# Patient Record
Sex: Female | Born: 1955 | Race: White | Hispanic: No | Smoking: Never smoker
Health system: Southern US, Community
[De-identification: ages and names within clinical notes are randomized; demographics above are authoritative.]

## PROBLEM LIST (undated history)

## (undated) DIAGNOSIS — R002 Palpitations: Secondary | ICD-10-CM

## (undated) DIAGNOSIS — E559 Vitamin D deficiency, unspecified: Secondary | ICD-10-CM

## (undated) DIAGNOSIS — K8689 Other specified diseases of pancreas: Secondary | ICD-10-CM

## (undated) DIAGNOSIS — J40 Bronchitis, not specified as acute or chronic: Secondary | ICD-10-CM

## (undated) DIAGNOSIS — G43909 Migraine, unspecified, not intractable, without status migrainosus: Secondary | ICD-10-CM

## (undated) DIAGNOSIS — E538 Deficiency of other specified B group vitamins: Secondary | ICD-10-CM

## (undated) DIAGNOSIS — G40909 Epilepsy, unspecified, not intractable, without status epilepticus: Secondary | ICD-10-CM

## (undated) DIAGNOSIS — R1013 Epigastric pain: Secondary | ICD-10-CM

## (undated) DIAGNOSIS — L570 Actinic keratosis: Secondary | ICD-10-CM

## (undated) DIAGNOSIS — K449 Diaphragmatic hernia without obstruction or gangrene: Secondary | ICD-10-CM

## (undated) DIAGNOSIS — I10 Essential (primary) hypertension: Secondary | ICD-10-CM

## (undated) HISTORY — DX: Actinic keratosis: L57.0

## (undated) HISTORY — PX: BREAST SURGERY: SHX581

## (undated) HISTORY — PX: OTHER SURGICAL HISTORY: SHX169

## (undated) HISTORY — DX: Migraine, unspecified, not intractable, without status migrainosus: G43.909

## (undated) HISTORY — PX: CHOLECYSTECTOMY: SHX55

## (undated) HISTORY — PX: BREAST CYST ASPIRATION: SHX578

## (undated) HISTORY — PX: HERNIA REPAIR: SHX51

## (undated) HISTORY — DX: Bronchitis, not specified as acute or chronic: J40

## (undated) HISTORY — PX: MASTECTOMY: SHX3

---

## 2005-01-31 ENCOUNTER — Ambulatory Visit: Payer: Self-pay

## 2005-11-07 ENCOUNTER — Ambulatory Visit: Payer: Self-pay | Admitting: Gastroenterology

## 2005-12-20 ENCOUNTER — Emergency Department: Payer: Self-pay | Admitting: Unknown Physician Specialty

## 2006-02-06 ENCOUNTER — Ambulatory Visit: Payer: Self-pay

## 2006-04-11 ENCOUNTER — Ambulatory Visit: Payer: Self-pay | Admitting: Gastroenterology

## 2006-04-22 ENCOUNTER — Ambulatory Visit: Payer: Self-pay | Admitting: General Surgery

## 2006-07-17 ENCOUNTER — Ambulatory Visit: Payer: Self-pay | Admitting: Gastroenterology

## 2007-02-15 ENCOUNTER — Emergency Department: Payer: Self-pay | Admitting: General Practice

## 2007-02-23 ENCOUNTER — Ambulatory Visit: Payer: Self-pay

## 2007-11-10 ENCOUNTER — Ambulatory Visit: Payer: Self-pay | Admitting: Gastroenterology

## 2007-11-26 ENCOUNTER — Ambulatory Visit: Payer: Self-pay | Admitting: Gastroenterology

## 2008-02-25 ENCOUNTER — Ambulatory Visit: Payer: Self-pay

## 2008-05-24 ENCOUNTER — Ambulatory Visit: Payer: Self-pay | Admitting: Oncology

## 2008-06-01 ENCOUNTER — Ambulatory Visit: Payer: Self-pay | Admitting: Oncology

## 2008-06-02 ENCOUNTER — Ambulatory Visit: Payer: Self-pay | Admitting: Family Medicine

## 2008-06-23 ENCOUNTER — Ambulatory Visit: Payer: Self-pay | Admitting: Oncology

## 2008-07-24 ENCOUNTER — Ambulatory Visit: Payer: Self-pay | Admitting: Oncology

## 2008-09-23 ENCOUNTER — Ambulatory Visit: Payer: Self-pay | Admitting: Oncology

## 2008-10-07 ENCOUNTER — Ambulatory Visit: Payer: Self-pay | Admitting: Oncology

## 2008-10-24 ENCOUNTER — Ambulatory Visit: Payer: Self-pay | Admitting: Oncology

## 2008-11-03 ENCOUNTER — Ambulatory Visit: Payer: Self-pay | Admitting: Ophthalmology

## 2008-12-06 ENCOUNTER — Ambulatory Visit: Payer: Self-pay | Admitting: Family Medicine

## 2009-02-27 ENCOUNTER — Ambulatory Visit: Payer: Self-pay | Admitting: Obstetrics and Gynecology

## 2009-03-02 ENCOUNTER — Ambulatory Visit: Payer: Self-pay | Admitting: Obstetrics and Gynecology

## 2009-03-23 ENCOUNTER — Ambulatory Visit: Payer: Self-pay | Admitting: Oncology

## 2009-04-14 ENCOUNTER — Ambulatory Visit: Payer: Self-pay | Admitting: Oncology

## 2009-04-23 ENCOUNTER — Ambulatory Visit: Payer: Self-pay | Admitting: Oncology

## 2009-05-24 ENCOUNTER — Ambulatory Visit: Payer: Self-pay | Admitting: Oncology

## 2009-06-14 ENCOUNTER — Ambulatory Visit: Payer: Self-pay | Admitting: Surgery

## 2012-12-28 ENCOUNTER — Ambulatory Visit: Payer: Self-pay | Admitting: Family Medicine

## 2013-11-13 ENCOUNTER — Emergency Department: Payer: Self-pay | Admitting: Internal Medicine

## 2013-11-29 ENCOUNTER — Encounter: Payer: Self-pay | Admitting: General Surgery

## 2013-12-07 ENCOUNTER — Encounter: Payer: Self-pay | Admitting: General Surgery

## 2013-12-07 ENCOUNTER — Ambulatory Visit (INDEPENDENT_AMBULATORY_CARE_PROVIDER_SITE_OTHER): Payer: No Typology Code available for payment source | Admitting: General Surgery

## 2013-12-07 VITALS — BP 134/66 | HR 84 | Resp 12 | Ht 62.0 in | Wt 178.0 lb

## 2013-12-07 DIAGNOSIS — K449 Diaphragmatic hernia without obstruction or gangrene: Secondary | ICD-10-CM

## 2013-12-07 NOTE — Patient Instructions (Addendum)
Upper Gastrointestinal Series An upper gastrointestinal (GI) series is a medical test with X-rays that helps diagnose problems of the upper GI tract. The upper GI tract includes the esophagus, stomach, and duodenum. The duodenum is the first part of the small intestine. This exam is used to look for heartburn, reflux, sores (ulcers), abnormal growths, and other problems. LET YOUR CAREGIVER KNOW ABOUT:  Allergies to food or medicine.  Medicines taken, including vitamins, herbs, eyedrops, over-the-counter medicines, and creams.  Previous problems with barium.  Any constipation problems.  Breastfeeding, if this applies.  Possibility of pregnancy, if this applies. RISKS AND COMPLICATIONS Problems do not occur often with this procedure, but they are possible. Possible problems include:  An allergic reaction to barium (rare).  Nausea after drinking the barium.  Problems from the very small amount of radiation exposure.  Cramps or constipation. BEFORE THE PROCEDURE  Several days before the exam, you may need to change what you eat. Follow your caregiver's directions.  The night before the exam, do not eat or drink anything after midnight.  Ask your caregiver if it is okay to take any needed medicines with a sip of water.  If you have diabetes and need to take insulin, ask for instructions on how to do this before the exam. PROCEDURE An upper GI series is done while you are awake. You will not need pain medicine. The exam usually takes about 1 hour. The length of the exam depends on how long it takes for the barium to move through your system. It also depends on what is found during the exam.   You will drink barium. This is like a milkshake that tastes like chalk. It might make you feel bloated. Barium helps the inside of the upper GI tract to show up clearly on the screen.  You may also swallow "fizzies." This is a substance that causes air to build up in your stomach.  A type of  X-ray called fluoroscopy will be used. You may need to stand up or lie on a table. The table may move or tilt. You may need to turn from side to side. This is done to get pictures from different angles.  The exam is done when all the needed pictures have been taken. AFTER THE PROCEDURE  You may go back to your normal diet and activities right away.  Ask when your test results will be ready. Follow up with your caregiver to discuss your test results. Document Released: 09/06/2000 Document Revised: 12/02/2011 Document Reviewed: 07/22/2011 [facility] Patient Information 2014 [place], [place].  Patient has been scheduled for an upper GI at [facility] for 12-14-13 at 9:30 (arrive 9 am). Prep: NPO after midnight. She is aware of all instructions.

## 2013-12-07 NOTE — Progress Notes (Addendum)
Patient ID: Shona Pardo, female   DOB: 05-13-1956, 58 y.o.   MRN: [id number]  Chief Complaint  Patient presents with  . Other    New Patient evaluation of hiatal hernia    HPI Donnalynn Wheeless is a 58 y.o. female who presents for an evaluation of a hiatal hernia. The patient presented to the emergency department in February with reports of persistent cough.  A chest xray was done 11-13-13. Patient states she having heart burn for the last 20 years. She states when she eat are gets some pain in the left upper quadrant occasionally. Patient states when the pain comes it last from 30 minutes  to one hour. She has found that the pain is directly related to the volume of food. She describes that she has had dilatations of the esophagus in the past. A cholecystectomy was completed in the past without any improvement in her postprandial pain. At this time, she is not reporting any dysphasia. She has lost about 20 pounds with modification of her diet (volume).  Inguinal hernia repairs (x3 on the right, x1 on the left) were completed at [facility]. Her cholecystectomy was completed locally.   The patient is employed as a Scientist, water quality at [employer].  HPI  Past Medical History  Diagnosis Date  . Migraine   . Bronchitis     Past Surgical History  Procedure Laterality Date  . Breast surgery Right     lumpectomy  . Cholecystectomy      [facility]  . Hernia repair Right     x 2  . Hernia repair Left     [place]  . Haital hernia      Family History  Problem Relation Age of Onset  . Cancer Mother     breast and colon  . Stroke Mother     Social History History  Substance Use Topics  . Smoking status: Never Smoker   . Smokeless tobacco: Never Used  . Alcohol Use: No    No Known Allergies  Current Outpatient Prescriptions  Medication Sig Dispense Refill  . amoxicillin (AMOXIL) 500 MG capsule Take 1 capsule by mouth 3 (three) times daily.      . Garlic 7048 MG CAPS Take 1 capsule by mouth  daily.      Marland Kitchen ibuprofen (ADVIL,MOTRIN) 200 MG tablet Take 200 mg by mouth every 4 (four) hours as needed.      . Multiple Vitamin (MULTIVITAMIN) tablet Take 1 tablet by mouth daily.       No current facility-administered medications for this visit.    Review of Systems Review of Systems  Constitutional: Negative.   Respiratory: Negative.   Cardiovascular: Negative.   Gastrointestinal: Negative.     Blood pressure 134/66, pulse 84, resp. rate 12, height 5\' 2"  (1.575 m), weight 178 lb (80.74 kg).  Physical Exam Physical Exam  Constitutional: She is oriented to person, place, and time. She appears well-developed and well-nourished.  Eyes: Conjunctivae are normal.  Neck: Neck supple.  Cardiovascular: Normal rate, regular rhythm, normal heart sounds, intact distal pulses and normal pulses.   Pulmonary/Chest: Effort normal and breath sounds normal.  Abdominal: Soft. Normal appearance and bowel sounds are normal.  Neurological: She is alert and oriented to person, place, and time.  Skin: Skin is warm and dry.    Data Reviewed Chest x-ray February 2015 shows a prominent gastric bubble above the level of the diaphragm.  CT scan dated December 06, 2008 showed a large hiatal hernia  with the entirety of the stomach present within the left hemithorax. This was reported to be unchanged compared to the April 11, 2006 exam.  [facility] Gastroenterology Patient Name: Jama Mcmiller Gender: F Procedure Date: 11/26/2007 03:35:00 PM MRN: [id number] Date of Birth: [birth year] Age: 29 Admit Type: Outpatient Room: Room 2 Account #: [id number] Note Status: Finalized Procedure:       Upper GI endoscopy Indications:     Dysphagia, Iron deficiency anemia Providers:       Kirk Ruths, MD Referring MD:    Amado Nash, MD Medicines:       Monitored Anesthesia Care Complications:   No immediate complications Procedure:       - After reviewing the risks and benefits, the patient  was deemed in satisfactory condition to undergo the procedure. After obtaining informed consent, the endoscope was passed under direct vision. Throughout the procedure, the patient's blood pressure, pulse, and oxygen saturations were monitored continuously. The Olympus GIF-160 endoscope (S#. [id number]) was introduced through the mouth, and advanced to the second part of duodenum. Findings: A mild Schatzki ring (acquired) was found in the gastroesophageal junction. A guidewire was placed and the scope was withdrawn. A 51 Fr Savary dilator was passed over the guidewire with mild resistance. Dilation was successful. A large hiatus hernia was present. The examined duodenum was normal. Impression:      - Schatzki ring (acquired). - Hiatus hernia. - Normal examined duodenum.     Assessment    Hiatal hernia, possible abdominal pain secondary to gaseous distention.  Considering long-standing findings on CT, possibility of developing gastric outlet obstruction is more likely.    Plan    An upper GI series will be obtained alkaline it was of this as a sliding or more likely paraesophageal hernia. Based on the review of this study, we'll make a recommendation of whether she would be best served with  Local intervention or in a specially center at [facility].     Patient has been scheduled for an upper GI at [facility] for 12-14-13 at 9:30 am (arrive 9 am). Prep: NPO after midnight. She is aware of all instructions.    Robert Bellow 12/07/2013, 9:36 PM

## 2013-12-14 ENCOUNTER — Telehealth: Payer: Self-pay

## 2013-12-14 ENCOUNTER — Encounter: Payer: Self-pay | Admitting: General Surgery

## 2013-12-14 ENCOUNTER — Ambulatory Visit: Payer: Self-pay | Admitting: General Surgery

## 2013-12-14 NOTE — Telephone Encounter (Signed)
Message copied by Lesly Rubenstein on Tue Dec 14, 2013  4:28 PM ------      Message from: Robert Bellow      Created: Tue Dec 14, 2013  3:54 PM       Please notify the patient and I reviewed her upper GI from this morning. [facility]. We can make arrangements for her to be seen either at [facility] or [facility] (she's been the [facility] before). Asked her to let us know where to make the referral. Thank you       ----- Message -----         From: Verlene Mayer, CMA         Sent: 12/14/2013  11:11 AM           To: Robert Bellow, MD                   ------

## 2013-12-14 NOTE — Telephone Encounter (Signed)
Message left for patient to call back for her upper GI results and to ask where she would like to be referred to for this problem.

## 2013-12-15 NOTE — Telephone Encounter (Signed)
Spoke with patient and let her know about your suggestion about having this taken care of at either [facility] or [place]. The patient said that she would like to be referred to [facility] since she has been there before. I let her know that we would make the referral for her.

## 2013-12-17 ENCOUNTER — Telehealth: Payer: Self-pay

## 2013-12-17 NOTE — Telephone Encounter (Signed)
Message copied by Lesly Rubenstein on Fri Dec 17, 2013 11:12 AM ------      Message from: [place], [place] W      Created: Thu Dec 16, 2013  9:09 AM       Please make a referral to Cammie Sickle, MD in the department of surgery at [facility] Phone:             [phone]             RE: Intra-thoracic stomach with symptoms.            We will need to have her CT from 2010 and recent UGI (March 2015) and CXR (Feb 2015) put on a disc for her to take to appointment.       When date scheduled, let me know and I will dictate a letter.  ------

## 2013-12-17 NOTE — Telephone Encounter (Signed)
Patient is scheduled to see Dr Cammie Sickle Ph: [phone] at [facility] on 01/17/14 at 2:00 pm. All records have been faxed to their office. The patient will come by here and pick up a CD with her Radiological studies on it as well as a copy of her records to take with her to her appointment. Patient is aware of date and time of appointment.

## 2014-01-10 ENCOUNTER — Ambulatory Visit: Payer: Self-pay | Admitting: Family Medicine

## 2014-07-25 ENCOUNTER — Encounter: Payer: Self-pay | Admitting: General Surgery

## 2015-01-23 ENCOUNTER — Other Ambulatory Visit: Payer: Self-pay | Admitting: Family Medicine

## 2015-01-23 DIAGNOSIS — Z1231 Encounter for screening mammogram for malignant neoplasm of breast: Secondary | ICD-10-CM

## 2015-02-07 ENCOUNTER — Ambulatory Visit
Admission: RE | Admit: 2015-02-07 | Discharge: 2015-02-07 | Disposition: A | Discharge status: Home / Self Care | Payer: No Typology Code available for payment source | Source: Ambulatory Visit | Attending: Family Medicine | Admitting: Family Medicine

## 2015-02-07 DIAGNOSIS — Z1231 Encounter for screening mammogram for malignant neoplasm of breast: Secondary | ICD-10-CM | POA: Diagnosis not present

## 2015-02-13 ENCOUNTER — Other Ambulatory Visit: Payer: Self-pay | Admitting: Family Medicine

## 2015-02-13 DIAGNOSIS — N63 Unspecified lump in unspecified breast: Secondary | ICD-10-CM

## 2015-02-13 DIAGNOSIS — R928 Other abnormal and inconclusive findings on diagnostic imaging of breast: Secondary | ICD-10-CM

## 2015-02-14 ENCOUNTER — Ambulatory Visit
Admission: RE | Admit: 2015-02-14 | Discharge: 2015-02-14 | Disposition: A | Discharge status: Home / Self Care | Payer: No Typology Code available for payment source | Source: Ambulatory Visit | Attending: Family Medicine | Admitting: Family Medicine

## 2015-02-14 ENCOUNTER — Ambulatory Visit: Payer: No Typology Code available for payment source

## 2015-02-14 DIAGNOSIS — R928 Other abnormal and inconclusive findings on diagnostic imaging of breast: Secondary | ICD-10-CM | POA: Insufficient documentation

## 2015-02-14 DIAGNOSIS — N63 Unspecified lump in unspecified breast: Secondary | ICD-10-CM

## 2015-05-25 ENCOUNTER — Emergency Department
Admission: EM | Admit: 2015-05-25 | Discharge: 2015-05-26 | Payer: No Typology Code available for payment source | Attending: Emergency Medicine | Admitting: Emergency Medicine

## 2015-05-25 ENCOUNTER — Emergency Department: Payer: No Typology Code available for payment source

## 2015-05-25 DIAGNOSIS — Z9889 Other specified postprocedural states: Secondary | ICD-10-CM | POA: Insufficient documentation

## 2015-05-25 DIAGNOSIS — Z79899 Other long term (current) drug therapy: Secondary | ICD-10-CM | POA: Insufficient documentation

## 2015-05-25 DIAGNOSIS — R1010 Upper abdominal pain, unspecified: Secondary | ICD-10-CM | POA: Diagnosis present

## 2015-05-25 DIAGNOSIS — K449 Diaphragmatic hernia without obstruction or gangrene: Secondary | ICD-10-CM

## 2015-05-25 DIAGNOSIS — K5660 Unspecified intestinal obstruction: Secondary | ICD-10-CM | POA: Diagnosis not present

## 2015-05-25 LAB — COMPREHENSIVE METABOLIC PANEL
ALK PHOS: 82 U/L (ref 38–126)
ALT: 14 U/L (ref 14–54)
AST: 24 U/L (ref 15–41)
Albumin: 3.8 g/dL (ref 3.5–5.0)
Anion gap: 9 (ref 5–15)
BUN: 10 mg/dL (ref 6–20)
CALCIUM: 8.5 mg/dL — AB (ref 8.9–10.3)
CHLORIDE: 106 mmol/L (ref 101–111)
CO2: 26 mmol/L (ref 22–32)
Creatinine, Ser: 0.73 mg/dL (ref 0.44–1.00)
GFR calc non Af Amer: >60 mL/min (ref >60)
GLUCOSE: 162 mg/dL — AB (ref 65–99)
Potassium: 3.3 mmol/L — ABNORMAL LOW (ref 3.5–5.1)
Sodium: 141 mmol/L (ref 135–145)
Total Bilirubin: 0.7 mg/dL (ref 0.3–1.2)
Total Protein: 7.1 g/dL (ref 6.5–8.1)

## 2015-05-25 LAB — CBC WITH DIFFERENTIAL/PLATELET
BASOS ABS: 0.1 10*3/uL (ref 0–0.1)
Basophils Relative: 1 %
Eosinophils Absolute: 0.1 10*3/uL (ref 0–0.7)
Eosinophils Relative: 1 %
HCT: 38.2 % (ref 35.0–47.0)
HEMOGLOBIN: 12.7 g/dL (ref 12.0–16.0)
LYMPHS ABS: 1.5 10*3/uL (ref 1.0–3.6)
LYMPHS PCT: 23 %
MCH: 26.7 pg (ref 26.0–34.0)
MCHC: 33.2 g/dL (ref 32.0–36.0)
MCV: 80.4 fL (ref 80.0–100.0)
Monocytes Absolute: 0.4 10*3/uL (ref 0.2–0.9)
Monocytes Relative: 6 %
NEUTROS PCT: 69 %
Neutro Abs: 4.6 10*3/uL (ref 1.4–6.5)
Platelets: 243 10*3/uL (ref 150–440)
RBC: 4.75 MIL/uL (ref 3.80–5.20)
RDW: 15.3 % — ABNORMAL HIGH (ref 11.5–14.5)
WBC: 6.7 10*3/uL (ref 3.6–11.0)

## 2015-05-25 LAB — LIPASE, BLOOD: Lipase: 21 U/L — ABNORMAL LOW (ref 22–51)

## 2015-05-25 MED ORDER — DIPHENHYDRAMINE HCL 50 MG/ML IJ SOLN
25.0000 mg | Freq: Once | INTRAMUSCULAR | Status: AC
  Administered 2015-05-25: 25 mg via INTRAVENOUS
  Filled 2015-05-25: qty 1

## 2015-05-25 MED ORDER — FAMOTIDINE 20 MG PO TABS
40.0000 mg | ORAL_TABLET | Freq: Once | ORAL | Status: AC
  Administered 2015-05-25: 40 mg via ORAL
  Filled 2015-05-25: qty 2

## 2015-05-25 MED ORDER — PROMETHAZINE HCL 25 MG/ML IJ SOLN
25.0000 mg | Freq: Once | INTRAMUSCULAR | Status: AC
  Administered 2015-05-25: 25 mg via INTRAVENOUS

## 2015-05-25 MED ORDER — SODIUM CHLORIDE 0.9 % IV BOLUS (SEPSIS)
1000.0000 mL | Freq: Once | INTRAVENOUS | Status: DC
Start: 2015-05-25 — End: 2015-05-26

## 2015-05-25 MED ORDER — MORPHINE SULFATE (PF) 4 MG/ML IV SOLN
4.0000 mg | Freq: Once | INTRAVENOUS | Status: AC
  Administered 2015-05-25: 4 mg via INTRAVENOUS
  Filled 2015-05-25: qty 1

## 2015-05-25 MED ORDER — GI COCKTAIL ~~LOC~~
30.0000 mL | ORAL | Status: AC
  Administered 2015-05-25: 30 mL via ORAL
  Filled 2015-05-25: qty 30

## 2015-05-25 MED ORDER — METOCLOPRAMIDE HCL 5 MG/ML IJ SOLN
10.0000 mg | Freq: Once | INTRAMUSCULAR | Status: AC
  Administered 2015-05-25: 10 mg via INTRAVENOUS
  Filled 2015-05-25: qty 2

## 2015-05-25 MED ORDER — PROMETHAZINE HCL 25 MG/ML IJ SOLN
INTRAMUSCULAR | Status: AC
  Administered 2015-05-25: 25 mg via INTRAVENOUS
  Filled 2015-05-25: qty 1

## 2015-05-25 MED ORDER — IOHEXOL 240 MG/ML SOLN
25.0000 mL | Freq: Once | INTRAMUSCULAR | Status: AC | PRN
  Administered 2015-05-25: 25 mL via ORAL

## 2015-05-25 NOTE — ED Notes (Signed)
Patient began with upper abdominal pain - more to center to left tonight. Has history of hiatal hernia and was repaired one year ago.

## 2015-05-25 NOTE — ED Provider Notes (Signed)
[facility] Emergency Department Provider Note  Time seen: 11:08 PM  I have reviewed the triage vital signs and the nursing notes.   HISTORY  Chief Complaint Abdominal Pain    HPI Mia Gray is a 59 y.o. female with a past medical history of migraines, chronic bronchitis, hiatal hernia status post repair 2 years ago who presents to the emergency department for upper abdominal pain. According to the patient she has been dealing with upper abdominal pain for years. She has mostly been seen at [facility]. States she had a hiatal hernia repair surgery done 2 years ago thinking this would fix the upper abdominal pain, it has not. The patient has seen her surgeon several times for the pain and they do not believe that it is due to her hiatal hernia, but cannot tell her what it is due to. Patient admits several ER visits mostly at [facility] for similar abdominal pain without findings. Patient does not know when her last ER visit was, does not know when her last imaging was, does not know if she has ever had an endoscopy, does not know if she has ever seen a GI doctor. States her surgeon was Dr. Margart Sickles at [facility]. Overall patient is a poor historian for her medical history, but does note this bout of abdominal pain started this afternoon and has worsened considerably tonight. Denies any fever, does state significant nausea but denies vomiting. Describes the pain as a moderate to severe burning/aching sensation in the upper to left upper quadrant abdomen. No modifying factors identified, patient has not eaten since pain began.     Past Medical History  Diagnosis Date  . Migraine   . Bronchitis     Patient Active Problem List   Diagnosis Date Noted  . Hiatal hernia 12/07/2013    Past Surgical History  Procedure Laterality Date  . Breast surgery Right     lumpectomy  . Cholecystectomy      [facility]  . Hernia repair Right     x 2  . Hernia repair Left     [place]  . Haital hernia    . Breast cyst aspiration Right 10+ yrs ago    negative    Current Outpatient Rx  Name  Route  Sig  Dispense  Refill  . amoxicillin (AMOXIL) 500 MG capsule   Oral   Take 1 capsule by mouth 3 (three) times daily.         . Garlic 8811 MG CAPS   Oral   Take 1 capsule by mouth daily.         Marland Kitchen ibuprofen (ADVIL,MOTRIN) 200 MG tablet   Oral   Take 200 mg by mouth every 4 (four) hours as needed.         . Multiple Vitamin (MULTIVITAMIN) tablet   Oral   Take 1 tablet by mouth daily.           Allergies Review of patient's allergies indicates no known allergies.  Family History  Problem Relation Age of Onset  . Cancer Mother     breast and colon  . Stroke Mother   . Breast cancer Mother     Social History Social History  Substance Use Topics  . Smoking status: Never Smoker   . Smokeless tobacco: Never Used  . Alcohol Use: No    Review of Systems Constitutional: Negative for fever. Cardiovascular: Negative for chest pain. Respiratory: Negative for shortness of breath. Gastrointestinal:  Upper abdominal pain, nausea. Negative for vomiting or diarrhea or constipation. Genitourinary: Negative for dysuria. Musculoskeletal: Negative for back pain. Neurological: Negative for headache 10-point ROS otherwise negative.  ____________________________________________   PHYSICAL EXAM:  VITAL SIGNS: ED Triage Vitals  Enc Vitals Group     BP 05/25/15 2227 158/101 mmHg     Pulse Rate 05/25/15 2227 97     Resp --      Temp 05/25/15 2227 98.1 F (36.7 C)     Temp Source 05/25/15 2227 Oral     SpO2 05/25/15 2227 92 %     Weight 05/25/15 2227 186 lb (84.369 kg)     Height 05/25/15 2227 5\' 6"  (1.676 m)     Head Cir --      Peak Flow --      Pain Score 05/25/15 2230 10     Pain Loc --      Pain Edu? --      Excl. in [place]? --     Constitutional: Alert and oriented. Well appearing and in no distress. Eyes: Normal exam ENT    Mouth/Throat: Mucous membranes are moist. Cardiovascular: Normal rate, regular rhythm. No murmur Respiratory: Normal respiratory effort without tachypnea nor retractions. Breath sounds are clear and equal bilaterally. No wheezes/rales/rhonchi. Gastrointestinal: Soft and nontender. No distention.  Musculoskeletal: Nontender with normal range of motion in all extremities.  Neurologic:  Normal speech and language. No gross focal neurologic deficits  Skin:  Skin is warm, dry and intact.  Psychiatric: Mood and affect are normal. Speech and behavior are normal.  ____________________________________________    EKG  EKG reviewed and interpreted by myself shows sinus tachycardia 107 bpm, narrow QRS, normal axis, normal intervals, nonspecific ST changes present. No ST elevations noted.  ____________________________________________    RADIOLOGY  CT shows distended stomach and esophagus, above the diaphragm. Most consistent bowel obstruction at the diaphragm given only trace amount of contrast into the duodenum.   ____________________________________________   INITIAL IMPRESSION / ASSESSMENT AND PLAN / ED COURSE  Pertinent labs & imaging results that were available during my care of the patient were reviewed by me and considered in my medical decision making (see chart for details).  Patient with intermittent upper abdominal pain for years, worse tonight. Patient states this is possibly the worst pain she has had yet. Denies any inciting factors that she could identify. Labs are largely within normal limits, urinalysis pending. We will treat pain, and proceed with CT abdomen/pelvis to help further evaluate. Patient agreeable to plan.  ----------------------------------------- 11:14 PM on 05/25/2015 -----------------------------------------  I reviewed the patient's care everywhere chart. It appears the patient has been seen GI medicine at [facility], has had endoscopies, esophageal  dilations, had a niece in procedure with gastropexy performed in July 2015. The patient also had a G-tube which was removed at that time as well. In reading the patient's notes this appears to be a fairly chronic issues since January 2015, but again the patient states this is the worst pain she has had so we will proceed with CT imaging to help further evaluate.   ----------------------------------------- 2:24 AM on 05/26/2015 -----------------------------------------  CT most consistent with obstruction at the diaphragm given a large hiatal hernia. I discussed the patient with [facility], they've excepted as an ER to ER transfer. ____________________________________________   FINAL CLINICAL IMPRESSION(S) / ED DIAGNOSES  Epigastric pain Bowel obstruction  Harvest Dark, MD 05/26/15 0225

## 2015-05-25 NOTE — ED Notes (Signed)
Patient given medication for pain. Tolerated well. Notes some improvement. Given contrast to begin drinking. Family at bedside, will let us know when contrast has been completed.

## 2015-05-26 ENCOUNTER — Emergency Department: Payer: No Typology Code available for payment source

## 2015-05-26 MED ORDER — HYDROMORPHONE HCL 1 MG/ML IJ SOLN
1.0000 mg | Freq: Once | INTRAMUSCULAR | Status: AC
Start: 2015-05-26 — End: 2015-05-26
  Administered 2015-05-26: 1 mg via INTRAVENOUS

## 2015-05-26 MED ORDER — HYDROMORPHONE HCL 1 MG/ML IJ SOLN
INTRAMUSCULAR | Status: AC
  Administered 2015-05-26: 1 mg via INTRAVENOUS
  Filled 2015-05-26: qty 1

## 2015-05-26 MED ORDER — HYDROMORPHONE HCL 1 MG/ML IJ SOLN
1.0000 mg | Freq: Once | INTRAMUSCULAR | Status: AC
  Administered 2015-05-26: 1 mg via INTRAVENOUS
  Filled 2015-05-26: qty 1

## 2015-05-26 MED ORDER — IOHEXOL 300 MG/ML  SOLN
100.0000 mL | Freq: Once | INTRAMUSCULAR | Status: AC | PRN
  Administered 2015-05-26: 100 mL via INTRAVENOUS

## 2015-05-26 NOTE — ED Notes (Signed)
Patient returned from [place]. Still c/o of abdominal pain. MD aware. Medications ordered and given. Patient resting quietly. Lights dimmed, call bell at bedside.

## 2015-05-26 NOTE — ED Notes (Signed)
Assumed pt care at this time. NAD noted. RR even and nonlabored. Will continue to monitor. 

## 2015-06-08 ENCOUNTER — Inpatient Hospital Stay
Admission: EM | Admit: 2015-06-08 | Discharge: 2015-06-12 | DRG: 378 | Disposition: A | Discharge status: Home / Self Care | Payer: No Typology Code available for payment source | Attending: Internal Medicine | Admitting: Internal Medicine

## 2015-06-08 ENCOUNTER — Encounter: Payer: Self-pay | Admitting: *Deleted

## 2015-06-08 DIAGNOSIS — I959 Hypotension, unspecified: Secondary | ICD-10-CM | POA: Diagnosis present

## 2015-06-08 DIAGNOSIS — K922 Gastrointestinal hemorrhage, unspecified: Secondary | ICD-10-CM | POA: Diagnosis present

## 2015-06-08 DIAGNOSIS — Z79899 Other long term (current) drug therapy: Secondary | ICD-10-CM | POA: Diagnosis not present

## 2015-06-08 DIAGNOSIS — Y848 Other medical procedures as the cause of abnormal reaction of the patient, or of later complication, without mention of misadventure at the time of the procedure: Secondary | ICD-10-CM | POA: Diagnosis present

## 2015-06-08 DIAGNOSIS — D62 Acute posthemorrhagic anemia: Secondary | ICD-10-CM | POA: Diagnosis present

## 2015-06-08 DIAGNOSIS — Z9889 Other specified postprocedural states: Secondary | ICD-10-CM

## 2015-06-08 DIAGNOSIS — Z931 Gastrostomy status: Secondary | ICD-10-CM | POA: Diagnosis not present

## 2015-06-08 DIAGNOSIS — Z800 Family history of malignant neoplasm of digestive organs: Secondary | ICD-10-CM

## 2015-06-08 DIAGNOSIS — E860 Dehydration: Secondary | ICD-10-CM | POA: Diagnosis present

## 2015-06-08 DIAGNOSIS — I10 Essential (primary) hypertension: Secondary | ICD-10-CM | POA: Diagnosis present

## 2015-06-08 DIAGNOSIS — D500 Iron deficiency anemia secondary to blood loss (chronic): Secondary | ICD-10-CM | POA: Diagnosis present

## 2015-06-08 DIAGNOSIS — Z823 Family history of stroke: Secondary | ICD-10-CM | POA: Diagnosis not present

## 2015-06-08 DIAGNOSIS — Z9049 Acquired absence of other specified parts of digestive tract: Secondary | ICD-10-CM | POA: Diagnosis present

## 2015-06-08 DIAGNOSIS — R609 Edema, unspecified: Secondary | ICD-10-CM

## 2015-06-08 DIAGNOSIS — D473 Essential (hemorrhagic) thrombocythemia: Secondary | ICD-10-CM | POA: Diagnosis present

## 2015-06-08 DIAGNOSIS — G43909 Migraine, unspecified, not intractable, without status migrainosus: Secondary | ICD-10-CM | POA: Diagnosis present

## 2015-06-08 DIAGNOSIS — K921 Melena: Secondary | ICD-10-CM | POA: Diagnosis not present

## 2015-06-08 DIAGNOSIS — T45515A Adverse effect of anticoagulants, initial encounter: Secondary | ICD-10-CM | POA: Diagnosis present

## 2015-06-08 DIAGNOSIS — I82621 Acute embolism and thrombosis of deep veins of right upper extremity: Secondary | ICD-10-CM | POA: Diagnosis present

## 2015-06-08 DIAGNOSIS — E861 Hypovolemia: Secondary | ICD-10-CM | POA: Diagnosis present

## 2015-06-08 DIAGNOSIS — K219 Gastro-esophageal reflux disease without esophagitis: Secondary | ICD-10-CM | POA: Diagnosis present

## 2015-06-08 DIAGNOSIS — I82A11 Acute embolism and thrombosis of right axillary vein: Secondary | ICD-10-CM | POA: Diagnosis present

## 2015-06-08 DIAGNOSIS — Z803 Family history of malignant neoplasm of breast: Secondary | ICD-10-CM | POA: Diagnosis not present

## 2015-06-08 HISTORY — DX: Diaphragmatic hernia without obstruction or gangrene: K44.9

## 2015-06-08 HISTORY — DX: Essential (primary) hypertension: I10

## 2015-06-08 LAB — CBC WITH DIFFERENTIAL/PLATELET
Basophils Absolute: 0.1 10*3/uL (ref 0–0.1)
Basophils Relative: 1 %
EOS ABS: 0 10*3/uL (ref 0–0.7)
EOS PCT: 0 %
HCT: 24.9 % — ABNORMAL LOW (ref 35.0–47.0)
Hemoglobin: 7.9 g/dL — ABNORMAL LOW (ref 12.0–16.0)
LYMPHS ABS: 0.5 10*3/uL — AB (ref 1.0–3.6)
Lymphocytes Relative: 5 %
MCH: 26.1 pg (ref 26.0–34.0)
MCHC: 31.8 g/dL — AB (ref 32.0–36.0)
MCV: 82 fL (ref 80.0–100.0)
MONO ABS: 0.4 10*3/uL (ref 0.2–0.9)
Monocytes Relative: 4 %
Neutro Abs: 9.5 10*3/uL — ABNORMAL HIGH (ref 1.4–6.5)
Neutrophils Relative %: 90 %
PLATELETS: 510 10*3/uL — AB (ref 150–440)
RBC: 3.03 MIL/uL — ABNORMAL LOW (ref 3.80–5.20)
RDW: 15.1 % — AB (ref 11.5–14.5)
WBC: 10.5 10*3/uL (ref 3.6–11.0)

## 2015-06-08 LAB — COMPREHENSIVE METABOLIC PANEL
ALT: 20 U/L (ref 14–54)
AST: 19 U/L (ref 15–41)
Albumin: 2.4 g/dL — ABNORMAL LOW (ref 3.5–5.0)
Alkaline Phosphatase: 103 U/L (ref 38–126)
Anion gap: 4 — ABNORMAL LOW (ref 5–15)
BUN: 16 mg/dL (ref 6–20)
CO2: 27 mmol/L (ref 22–32)
CREATININE: 0.66 mg/dL (ref 0.44–1.00)
Calcium: 7.5 mg/dL — ABNORMAL LOW (ref 8.9–10.3)
Chloride: 109 mmol/L (ref 101–111)
GFR calc Af Amer: >60 mL/min (ref >60)
Glucose, Bld: 122 mg/dL — ABNORMAL HIGH (ref 65–99)
POTASSIUM: 5 mmol/L (ref 3.5–5.1)
Sodium: 140 mmol/L (ref 135–145)
Total Bilirubin: 0.7 mg/dL (ref 0.3–1.2)
Total Protein: 5.5 g/dL — ABNORMAL LOW (ref 6.5–8.1)

## 2015-06-08 LAB — PREPARE RBC (CROSSMATCH)

## 2015-06-08 LAB — MRSA PCR SCREENING: MRSA BY PCR: NEGATIVE

## 2015-06-08 LAB — ABO/RH: ABO/RH(D): A POS

## 2015-06-08 LAB — TROPONIN I: Troponin I: <0.03 ng/mL (ref <0.031)

## 2015-06-08 MED ORDER — ALBUTEROL SULFATE (2.5 MG/3ML) 0.083% IN NEBU: 2.5000 mg | INHALATION_SOLUTION | Freq: Four times a day (QID) | RESPIRATORY_TRACT | Status: DC | PRN

## 2015-06-08 MED ORDER — PANTOPRAZOLE SODIUM 40 MG IV SOLR: 40.0000 mg | Freq: Two times a day (BID) | INTRAVENOUS | Status: DC

## 2015-06-08 MED ORDER — SODIUM CHLORIDE 0.9 % IV SOLN
8.0000 mg/h | INTRAVENOUS | Status: AC
  Administered 2015-06-08 – 2015-06-10 (×3): 8 mg/h via INTRAVENOUS
  Filled 2015-06-08 (×4): qty 80

## 2015-06-08 MED ORDER — IPRATROPIUM BROMIDE 0.02 % IN SOLN: 2.5000 mL | Freq: Four times a day (QID) | RESPIRATORY_TRACT | Status: DC | PRN

## 2015-06-08 MED ORDER — ACETAMINOPHEN 500 MG PO TABS: 1000.0000 mg | ORAL_TABLET | Freq: Four times a day (QID) | ORAL | Status: DC | PRN

## 2015-06-08 MED ORDER — ONDANSETRON 4 MG PO TBDP: 4.0000 mg | ORAL_TABLET | Freq: Three times a day (TID) | ORAL | Status: DC | PRN

## 2015-06-08 MED ORDER — ACETAMINOPHEN 325 MG PO TABS
650.0000 mg | ORAL_TABLET | Freq: Four times a day (QID) | ORAL | Status: DC | PRN
  Administered 2015-06-08: 650 mg via ORAL
  Filled 2015-06-08: qty 2

## 2015-06-08 MED ORDER — SALINE SPRAY 0.65 % NA SOLN
1.0000 | NASAL | Status: DC | PRN
  Filled 2015-06-08: qty 44

## 2015-06-08 MED ORDER — SODIUM CHLORIDE 0.9 % IV SOLN: 10.0000 mL/h | Freq: Once | INTRAVENOUS | Status: DC

## 2015-06-08 MED ORDER — SODIUM CHLORIDE 0.9 % IV SOLN: 8.0000 mg/h | INTRAVENOUS | Status: DC

## 2015-06-08 MED ORDER — AMITRIPTYLINE HCL 50 MG PO TABS
50.0000 mg | ORAL_TABLET | Freq: Every day | ORAL | Status: DC
  Administered 2015-06-09 – 2015-06-11 (×3): 50 mg via ORAL
  Filled 2015-06-08 (×3): qty 1

## 2015-06-08 MED ORDER — SODIUM CHLORIDE 0.9 % IV SOLN
INTRAVENOUS | Status: DC
  Administered 2015-06-08 – 2015-06-12 (×7): via INTRAVENOUS

## 2015-06-08 MED ORDER — ONDANSETRON HCL 4 MG PO TABS
4.0000 mg | ORAL_TABLET | Freq: Four times a day (QID) | ORAL | Status: DC | PRN
Start: 2015-06-08 — End: 2015-06-12

## 2015-06-08 MED ORDER — SODIUM CHLORIDE 0.9 % IV SOLN
10.0000 mL/h | Freq: Once | INTRAVENOUS | Status: AC
  Administered 2015-06-09: 10 mL/h via INTRAVENOUS

## 2015-06-08 MED ORDER — DOCUSATE SODIUM 100 MG PO CAPS: 100.0000 mg | ORAL_CAPSULE | Freq: Two times a day (BID) | ORAL | Status: DC | PRN

## 2015-06-08 MED ORDER — SODIUM CHLORIDE 0.9 % IV SOLN
80.0000 mg | Freq: Once | INTRAVENOUS | Status: AC
  Administered 2015-06-08: 80 mg via INTRAVENOUS
  Filled 2015-06-08 (×2): qty 80

## 2015-06-08 MED ORDER — SODIUM CHLORIDE 0.9 % IV BOLUS (SEPSIS)
1000.0000 mL | Freq: Once | INTRAVENOUS | Status: AC
  Administered 2015-06-08: 1000 mL via INTRAVENOUS

## 2015-06-08 MED ORDER — SODIUM CHLORIDE 0.9 % IV SOLN: 80.0000 mg | Freq: Once | INTRAVENOUS | Status: DC

## 2015-06-08 MED ORDER — ONDANSETRON HCL 4 MG/2ML IJ SOLN: 4.0000 mg | Freq: Four times a day (QID) | INTRAMUSCULAR | Status: DC | PRN

## 2015-06-08 MED ORDER — ACETAMINOPHEN 650 MG RE SUPP: 650.0000 mg | Freq: Four times a day (QID) | RECTAL | Status: DC | PRN

## 2015-06-08 MED ORDER — LORATADINE 10 MG PO TABS
10.0000 mg | ORAL_TABLET | Freq: Every day | ORAL | Status: DC
  Administered 2015-06-09 – 2015-06-12 (×4): 10 mg via ORAL
  Filled 2015-06-08 (×4): qty 1

## 2015-06-08 NOTE — ED Provider Notes (Addendum)
[facility] Emergency Department Provider Note   ____________________________________________  Time seen: 1:30 PM I have reviewed the triage vital signs and the triage nursing note.  HISTORY  Chief Complaint GI Bleeding   Historian Patient, who is a bit of a poor historian  HPI Mia KOMAN is a 59 y.o. female who is presenting due to weakness and inability to stand up due to selection to passout. She is also noticed to have bright red blood in her Gtube.  Patient was seen in the emergency department at [facility] last week for abdominal pain and found to have bowel obstruction at the hiatal hernia, for which she was transferred to [facility] and had surgery. Patient was discharged on 06/03/15.  Patient states that she started her Xarelto back at that time.  She is not really complaining of abdominal pain today. She is mostly complaining of weakness and nausea.    Past Medical History  Diagnosis Date  . Migraine   . Bronchitis     Patient Active Problem List   Diagnosis Date Noted  . Hiatal hernia 12/07/2013    Past Surgical History  Procedure Laterality Date  . Breast surgery Right     lumpectomy  . Cholecystectomy      [facility]  . Hernia repair Right     x 2  . Hernia repair Left     [place]  . Haital hernia    . Breast cyst aspiration Right 10+ yrs ago    negative    Current Outpatient Rx  Name  Route  Sig  Dispense  Refill  . acetaminophen (TYLENOL) 500 MG tablet   Oral   Take 1,000 mg by mouth every 6 (six) hours as needed.         Marland Kitchen albuterol (PROVENTIL HFA;VENTOLIN HFA) 108 (90 BASE) MCG/ACT inhaler   Inhalation   Inhale 1 puff into the lungs every 6 (six) hours as needed for wheezing or shortness of breath.         Marland Kitchen amitriptyline (ELAVIL) 25 MG tablet   Oral   Take 50 mg by mouth at bedtime.         . cetirizine (ZYRTEC) 10 MG tablet   Oral   Take 10 mg by mouth daily as needed for allergies.         Marland Kitchen docusate  sodium (COLACE) 100 MG capsule   Oral   Take 100 mg by mouth 2 (two) times daily as needed for mild constipation.         Marland Kitchen ipratropium (ATROVENT HFA) 17 MCG/ACT inhaler   Inhalation   Inhale 2 puffs into the lungs every 6 (six) hours as needed for wheezing.         . metoprolol tartrate (LOPRESSOR) 25 MG tablet   Oral   Take 12.5 mg by mouth 2 (two) times daily.         . ondansetron (ZOFRAN-ODT) 4 MG disintegrating tablet   Oral   Take 4 mg by mouth every 8 (eight) hours as needed for nausea or vomiting.         . sodium chloride (OCEAN) 0.65 % SOLN nasal spray   Each Nare   Place 1 spray into both nostrils as needed for congestion.         . SUMAtriptan (IMITREX) 50 MG tablet   Oral   Take 50 mg by mouth every 2 (two) hours as needed for migraine. May repeat in 2 hours if headache persists  or recurs.           Allergies Review of patient's allergies indicates no known allergies.  Family History  Problem Relation Age of Onset  . Cancer Mother     breast and colon  . Stroke Mother   . Breast cancer Mother     Social History Social History  Substance Use Topics  . Smoking status: Never Smoker   . Smokeless tobacco: Never Used  . Alcohol Use: No    Review of Systems  Constitutional: Negative for fever. Eyes: Negative for visual changes. ENT: Negative for sore throat. Cardiovascular: Negative for chest pain. Respiratory: Negative for shortness of breath. Gastrointestinal: Positive for black stools Genitourinary: Negative for dysuria. Musculoskeletal: Negative for back pain. Skin: Negative for rash. Neurological: Negative for headache. 10 point Review of Systems otherwise negative ____________________________________________   PHYSICAL EXAM:  VITAL SIGNS: ED Triage Vitals  Enc Vitals Group     BP 06/08/15 1259 95/77 mmHg     Pulse Rate 06/08/15 1259 118     Resp --      Temp 06/08/15 1259 97.6 F (36.4 C)     Temp Source 06/08/15 1259  Oral     SpO2 06/08/15 1259 95 %     Weight 06/08/15 1259 170 lb (77.111 kg)     Height 06/08/15 1259 5\' 7"  (1.702 m)     Head Cir --      Peak Flow --      Pain Score 06/08/15 1300 0     Pain Loc --      Pain Edu? --      Excl. in [place]? --      Constitutional: Alert and oriented. Well appearing and in no distress. Eyes: Conjunctivae are normal. PERRL. Normal extraocular movements. ENT   Head: Normocephalic and atraumatic.   Nose: No congestion/rhinnorhea.   Mouth/Throat: Mucous membranes are moist.   Neck: No stridor. Cardiovascular/Chest: Normal rate, regular rhythm.  No murmurs, rubs, or gallops. Respiratory: Normal respiratory effort without tachypnea nor retractions. Breath sounds are clear and equal bilaterally. No wheezes/rales/rhonchi. Gastrointestinal: Soft. No distention, no guarding, no rebound. Nontender. Obese. Healing incision points across the abdomen. G-tube in place. G-tube has dark blood in it.  Genitourinary/rectal:Deferred Musculoskeletal: Nontender with normal range of motion in all extremities. No joint effusions.  No lower extremity tenderness.  No edema. Neurologic:  Normal speech and language. No gross or focal neurologic deficits are appreciated. Skin:  Skin is warm, dry and intact. No rash noted. Psychiatric: Mood and affect are normal. Speech and behavior are normal. Patient exhibits appropriate insight and judgment.  ____________________________________________   EKG I, Lisa Roca, MD, the attending physician have personally viewed and interpreted all ECGs.  106 beats per appears sinus tachycardia. Narrow QRS. Normal axis. Normal ST and T-wave. ____________________________________________  LABS (pertinent positives/negatives)  Troponin less than 0.03 White blood cell count is 10.5, hemoglobin 7.9, platelet count 510 Comprehensive metabolic panel without significant  abnormality   ____________________________________________  RADIOLOGY All Xrays were viewed by me. Imaging interpreted by Radiologist.  None __________________________________________  PROCEDURES  Procedure(s) performed: None  Critical Care performed: CRITICAL CARE Performed by: Lisa Roca   Total critical care time: 45 minutes  Critical care time was exclusive of separately billable procedures and treating other patients.  Critical care was necessary to treat or prevent imminent or life-threatening deterioration.  Critical care was time spent personally by me on the following activities: development of treatment plan with patient and/or surrogate  as well as nursing, discussions with consultants, evaluation of patient's response to treatment, examination of patient, obtaining history from patient or surrogate, ordering and performing treatments and interventions, ordering and review of laboratory studies, ordering and review of radiographic studies, pulse oximetry and re-evaluation of patient's condition.   ____________________________________________   ED COURSE / ASSESSMENT AND PLAN  CONSULTATIONS: Face-to-face with hospitalist for admission  Pertinent labs & imaging results that were available during my care of the patient were reviewed by me and considered in my medical decision making (see chart for details).  Nurse come into the room because the patient was hypotensive with a blood pressure of 60/40. Patient is able to talk to me and states that this is how she's felt all day and hasn't been able to stand up due to feeling weak and dizzy. She noticed blood in her G-tube. She is on Xarelto and recently had surgery and was released from the hospital 5 days ago at [facility] for bowel obstruction/esophageal hernia repair.  Patient is having no abdominal pain now, and I do not suspect postsurgical complications such as dehiscence, or infection. I suspect her bleeding is  probably due to upper GI bleed/bruising postsurgical as the patient restarted her Xarelto. At this point and do not see utility in CT scan abdomen.  Patient was given 2 L normal saline that did stabilize her vital signs. She became normotensive with a blood pressure over 100. Upon receipt of hemoglobin at 7.9, in the setting of acute bleeding, one unit packed red blood cells was ordered for transfusion.  On reexamination at 3:15 PM, patient is again tachycardic to 1:15 and hypotensive 90/50. She is again having no pain. I've added a second unit of packed red blood cells to transfuse order. Patient has not had the first unit yet. In the interim, patient will start her third liter normal saline bolus.   Patient had asked to be transferred to [facility] where she had recent surgery, however [facility] is on diversion other than trauma transfers. Patient will be admitted here to the ICU.  ----------------------------------------- 3:49 PM on 06/08/2015 -----------------------------------------  In discussion with the pharmacy tech of the medications, she talked with the patient in the patient is now denying that she is on Xarelto as well as the son states the patient has not been on Xarelto.   Patient / Family / Caregiver informed of clinical course, medical decision-making process, and agree with plan.   I discussed return precautions, follow-up instructions, and discharged instructions with patient and/or family.  ___________________________________________   FINAL CLINICAL IMPRESSION(S) / ED DIAGNOSES   Final diagnoses:  Upper GI bleeding       Lisa Roca, MD 06/08/15 He[place], MD 06/08/15 1550

## 2015-06-08 NOTE — ED Notes (Signed)
Per EMS, pt here last week, was DC'd home after G tube placement, for esophogeal bleeding, care taker at home noticed blood from G-tube today.

## 2015-06-08 NOTE — H&P (Signed)
Mia Gray at [facility] NAME: Mia Gray    MR#:  [id number]  DATE OF BIRTH:  [birth year]  DATE OF ADMISSION:  06/08/2015  PRIMARY CARE PHYSICIAN: Letta Median, MD   REQUESTING/REFERRING PHYSICIAN: Dr. Lisa Roca  CHIEF COMPLAINT:   Chief Complaint  Patient presents with  . GI Bleeding   dizziness, hypotension.  HISTORY OF PRESENT ILLNESS:  Mia Gray  is a 59 y.o. 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 Patient says that she's been feeling dizzy now for the past 2 days where she feels like when she gets up that she is going to pass out. She has also noticed black dark stools now for the past week to 10 days and also noted some bloody drainage from her PEG tube. Since she was not feeling much better she came to the ER for further evaluation and she was noted to be hypotensive with systolic blood pressures in the 60s. Patient was given 2 L IV fluid bolus and her hemodynamics have improved but she was also noted to be anemic with a hemoglobin of 7.9 and a previous hemoglobin by 2 weeks ago was around 12. She likely has underlying upper GI bleed given her melanotic stools hypotension and acute blood loss anemia and hospitalist services were contacted further treatment and evaluation. Patient denies any hematemesis, abdominal pain, nausea, vomiting, fevers, chills, chest pain, shortness of breath or any other associated symptoms.  PAST MEDICAL HISTORY:   Past Medical History  Diagnosis Date  . Migraine   . Bronchitis   . Hiatal hernia   . Hypertension     PAST SURGICAL HISTORY:   Past Surgical History  Procedure Laterality Date  . Breast surgery Right     lumpectomy  . Cholecystectomy      [facility]  .  Hernia repair Right     x 2  . Hernia repair Left     [place]  . Haital hernia    . Breast cyst aspiration Right 10+ yrs ago    negative    SOCIAL HISTORY:   Social History  Substance Use Topics  . Smoking status: Never Smoker   . Smokeless tobacco: Never Used  . Alcohol Use: No    FAMILY HISTORY:   Family History  Problem Relation Age of Onset  . Cancer Mother     breast and colon  . Stroke Mother   . Breast cancer Mother     DRUG ALLERGIES:  No Known Allergies  REVIEW OF SYSTEMS:   Review of Systems  Constitutional: Negative for fever and weight loss.  HENT: Negative for congestion, nosebleeds and tinnitus.   Eyes: Negative for blurred vision, double vision and redness.  Respiratory: Negative for cough, hemoptysis and shortness of breath.   Cardiovascular: Negative for chest pain, orthopnea, leg swelling and PND.  Gastrointestinal: Positive for nausea and melena. Negative for vomiting, abdominal pain and diarrhea.  Genitourinary: Negative for dysuria, urgency and hematuria.  Musculoskeletal: Negative for joint pain and falls.  Neurological: Positive for dizziness and weakness (generalized). Negative for tingling, sensory change, focal weakness, seizures and headaches.  Endo/Heme/Allergies: Negative for polydipsia. Does not bruise/bleed easily.  Psychiatric/Behavioral: Negative for depression and memory loss. The patient is not nervous/anxious.     MEDICATIONS AT HOME:  Prior to Admission medications   Medication Sig Start Date End Date Taking? Authorizing Provider  acetaminophen (TYLENOL) 500 MG tablet Take 1,000 mg by mouth every 6 (six) hours as needed.   Yes Historical Provider, MD  albuterol (PROVENTIL HFA;VENTOLIN HFA) 108 (90 BASE) MCG/ACT inhaler Inhale 1 puff into the lungs every 6 (six) hours as needed for wheezing or shortness of breath.   Yes Historical Provider, MD  amitriptyline (ELAVIL) 25 MG tablet Take 50 mg by mouth at bedtime.   Yes  Historical Provider, MD  cetirizine (ZYRTEC) 10 MG tablet Take 10 mg by mouth daily as needed for allergies.   Yes Historical Provider, MD  docusate sodium (COLACE) 100 MG capsule Take 100 mg by mouth 2 (two) times daily as needed for mild constipation.   Yes Historical Provider, MD  ipratropium (ATROVENT HFA) 17 MCG/ACT inhaler Inhale 2 puffs into the lungs every 6 (six) hours as needed for wheezing.   Yes Historical Provider, MD  metoprolol tartrate (LOPRESSOR) 25 MG tablet Take 12.5 mg by mouth 2 (two) times daily.   Yes Historical Provider, MD  ondansetron (ZOFRAN-ODT) 4 MG disintegrating tablet Take 4 mg by mouth every 8 (eight) hours as needed for nausea or vomiting.   Yes Historical Provider, MD  Rivaroxaban (XARELTO) 15 MG TABS tablet Take 15 mg by mouth 2 (two) times daily with a meal.   Yes Historical Provider, MD  sodium chloride (OCEAN) 0.65 % SOLN nasal spray Place 1 spray into both nostrils as needed for congestion.   Yes Historical Provider, MD  SUMAtriptan (IMITREX) 50 MG tablet Take 50 mg by mouth every 2 (two) hours as needed for migraine. May repeat in 2 hours if headache persists or recurs.   Yes Historical Provider, MD      VITAL SIGNS:  Blood pressure 87/47, pulse 105, temperature 97.6 F (36.4 C), temperature source Oral, resp. rate 24, height 5\' 7"  (1.702 m), weight 77.111 kg (170 lb), SpO2 95 %.  PHYSICAL EXAMINATION:  Physical Exam  GENERAL:  59 y.o.-year-old pale appearing patient lying in the bed with no acute distress.  EYES: Pupils equal, round, reactive to light and accommodation. No scleral icterus. Extraocular muscles intact.  HEENT: Gray atraumatic, normocephalic. Oropharynx and nasopharynx clear. No oropharyngeal erythema, moist oral mucosa  NECK:  Supple, no jugular venous distention. No thyroid enlargement, no tenderness.  LUNGS: Normal breath sounds bilaterally, no wheezing, rales, rhonchi. No use of accessory muscles of respiration.  CARDIOVASCULAR: S1,  S2 RRR. No murmurs, rubs, gallops, clicks.  ABDOMEN: Soft, nontender, nondistended. Hypoactive Bowel sounds. No organomegaly or mass. Positive PEG tube in place with slight bloody drainage. EXTREMITIES: No pedal edema, cyanosis, or clubbing. + 2 pedal & radial pulses b/l.   NEUROLOGIC: Cranial nerves II through XII are intact. No focal Motor or sensory deficits appreciated b/l PSYCHIATRIC: The patient is alert and oriented x 3. Good affect.  SKIN: No obvious rash, lesion, or ulcer.   LABORATORY PANEL:   CBC  Recent Labs Lab 06/08/15 1346  WBC 10.5  HGB 7.9*  HCT 24.9*  PLT 510*   ------------------------------------------------------------------------------------------------------------------  Chemistries   Recent Labs Lab 06/08/15 1346  NA 140  K 5.0  CL 109  CO2 27  GLUCOSE 122*  BUN 16  CREATININE 0.66  CALCIUM 7.5*  AST 19  ALT 20  ALKPHOS 103  BILITOT 0.7   ------------------------------------------------------------------------------------------------------------------  Cardiac Enzymes  Recent Labs Lab 06/08/15 1346  TROPONINI <0.03   ------------------------------------------------------------------------------------------------------------------  RADIOLOGY:  No results found.   IMPRESSION AND PLAN:   59 year old female with past medical history of hypertension, migraines, GERD, hiatal hernia, history of recent surgery due to incarcerated paraesophageal hernia status post gastrorrhaphy and PEG placement, who presents to the hospital due to dizziness, melanotic stools and noted to be anemic.   #1 upper GI bleed-this is likely the cause of patient's hypotension, melanotic stools and anemia. -Patient was also on Xarelto for a PICC line associated DVT which contributed to this. I will hold his Xarelto. -Patient will be transfused a total of 2 units of packed red blood cells. I will start her on a Protonix drip. -Follow serial hemoglobin. Get a GI and  also surgical consult.  #2 acute blood loss anemia-likely secondary to GI bleed. -Hold Xarelto, transfused 2 units of packed red blood cells and follow serial hemoglobin.  #3 hypotension-this is likely secondary to dehydration and volume loss. Patient will be given IV fluids, transfused blood and will follow hemodynamics. Hold metoprolol.  #4 history of migraines-continue Elavil.  #5 hypertension-patient is actually hypotensive and therefore will hold metoprolol.  #6 thrombocytosis-this is likely reactive secondary to patient's acute blood loss anemia and will follow platelet count.   All the records are reviewed and case discussed with ED provider. Management plans discussed with the patient, family and they are in agreement.  CODE STATUS: Full  TOTAL TIME TAKING CARE OF THIS PATIENT: 50 minutes.    Henreitta Leber M.D on 06/08/2015 at 4:42 PM  Between 7am to 6pm - Pager - [phone]  After 6pm go to [email or web address] - password [email or web address] [facility]  Office  [phone]  CC: Primary care physician; Letta Median, MD

## 2015-06-08 NOTE — Consult Note (Signed)
Patient ID: Mia Gray, female   DOB: 1956/04/21, 59 y.o.   MRN: [id number]  HPI Mia Gray is a 59 y.o. female seen in emergency room with sudden onset of GI bleeding.  HPI she has a complicated past history. In July 2015 she underwent a laparoscopic hiatal hernia repair with fundoplication. She was seen in our emergency room early in September with increasing upper abdominal pain. She had been having symptoms for some time and had seen her primary physicians and surgical physicians at the [facility]. She had not had any further workup. On her evaluation here in the emergency room in early September she underwent CT scanning was demonstrated incarcerated hiatal hernia with most of her stomach in her chest. She appeared to have some possible gastric ischemia and some duodenal obstruction. She was transferred urgently to the [facility].  She underwent emergent laparoscopic evaluation with reduction of the intrathoracic stomach. Reduction required a gastrotomy to evacuate all of the retained food particles and gastric secretions. The fundoplication appeared to be intact. The diaphragmatic defect was repaired. A gastrostomy tube was placed. Her postoperative course was difficult. She suffered an upper extremity DVT from PICC line was placed on Xarelto. She was discharged home 5 days ago. She's been followed by the home health nursing staff.  This morning she awoke and noticed some blood in her gastrostomy tube. She then went to the bathroom and had a large bloody bowel movement. She was quite lightheaded lethargic and dizzy returned to her bed and was then evaluated by her home health nurse. They recommended urgent transport to the emergency room which was provided. On ED evaluation she's noted be markedly hypotensive with a blood pressure 60/40 moderately tachycardic. Her hemoglobin was 7.9. She does have some dark bloody material in her gastrostomy tube. [facility] and they refused transfer as they are "diversion". The patient is now admitted to the internal medicine service and they have requested a surgical consultation for her GI bleeding.  All this information is retrieved from her computer record at [facility]. We do not have a very good history from the patient. She has taken her Xarelto up through yesterday morning  Past Medical History  Diagnosis Date  . Migraine   . Bronchitis   . Hiatal hernia   . Hypertension     Past Surgical History  Procedure Laterality Date  . Breast surgery Right     lumpectomy  . Cholecystectomy      [facility]  . Hernia repair Right     x 2  . Hernia repair Left     [facility]  . Haital hernia    . Breast cyst aspiration Right 10+ yrs ago    negative    Family History  Problem Relation Age of Onset  . Cancer Mother     breast and colon  . Stroke Mother   . Breast cancer Mother     Social History Social History  Substance Use Topics  . Smoking status: Never Smoker   . Smokeless tobacco: Never Used  . Alcohol Use: No    No Known Allergies  Current Facility-Administered Medications  Medication Dose Route Frequency Provider Last Rate Last Dose  . 0.9 %  sodium chloride infusion  10 mL/hr Intravenous Once Lisa Roca, MD      . 0.9 %  sodium chloride infusion  10 mL/hr Intravenous Once Lisa Roca, MD      .  pantoprazole (PROTONIX) 80 mg in sodium chloride 0.9 % 250 mL (0.32 mg/mL) infusion  8 mg/hr Intravenous Continuous Lisa Roca, MD 25 mL/hr at 06/08/15 1510 8 mg/hr at 06/08/15 1510   Current Outpatient Prescriptions  Medication Sig Dispense Refill  . acetaminophen (TYLENOL) 500 MG tablet Take 1,000 mg by mouth every 6 (six) hours as needed.    Marland Kitchen albuterol (PROVENTIL HFA;VENTOLIN HFA) 108 (90 BASE) MCG/ACT inhaler Inhale 1 puff into the lungs every 6 (six) hours as needed for wheezing or shortness of breath.    Marland Kitchen amitriptyline (ELAVIL) 25 MG tablet  Take 50 mg by mouth at bedtime.    . cetirizine (ZYRTEC) 10 MG tablet Take 10 mg by mouth daily as needed for allergies.    Marland Kitchen docusate sodium (COLACE) 100 MG capsule Take 100 mg by mouth 2 (two) times daily as needed for mild constipation.    Marland Kitchen ipratropium (ATROVENT HFA) 17 MCG/ACT inhaler Inhale 2 puffs into the lungs every 6 (six) hours as needed for wheezing.    . metoprolol tartrate (LOPRESSOR) 25 MG tablet Take 12.5 mg by mouth 2 (two) times daily.    . ondansetron (ZOFRAN-ODT) 4 MG disintegrating tablet Take 4 mg by mouth every 8 (eight) hours as needed for nausea or vomiting.    . Rivaroxaban (XARELTO) 15 MG TABS tablet Take 15 mg by mouth 2 (two) times daily with a meal.    . sodium chloride (OCEAN) 0.65 % SOLN nasal spray Place 1 spray into both nostrils as needed for congestion.    . SUMAtriptan (IMITREX) 50 MG tablet Take 50 mg by mouth every 2 (two) hours as needed for migraine. May repeat in 2 hours if headache persists or recurs.       Review of Systems A 10 point review of systems was asked and was negative except for the following positive findings: Noted above  Physical Exam Blood pressure 87/47, pulse 105, temperature 97.6 F (36.4 C), temperature source Oral, resp. rate 24, height 5\' 7"  (1.702 m), weight 77.111 kg (170 lb), SpO2 95 %. CONSTITUTIONAL: She appears pale lethargic washed out and uncomfortable. She does not complain of any pain at the present time.Marland Kitchen EYES: Pupils are equal, round, and reactive to light, Sclera are non-icteric. EARS, NOSE, MOUTH AND THROAT: The oropharynx is clear. The oral mucosa is pink and moist. Hearing is intact to voice. LYMPH NODES:  Lymph nodes in the neck are normal. RESPIRATORY:  Lungs are clear. There is normal respiratory effort, with equal breath sounds bilaterally, and without pathologic use of accessory muscles. CARDIOVASCULAR: Heart is regular without murmurs, gallops, or rubs. GI: The abdomen is soft with well-healed incisions ,  nontender, and nondistended. There are no palpable masses. There is no hepatosplenomegaly. There are normal bowel sounds in all quadrants. GU: Rectal deferred.   MUSCULOSKELETAL: Normal muscle strength and tone. No cyanosis or edema.   SKIN: Turgor is good and there are no pathologic skin lesions or ulcers. NEUROLOGIC: Motor and sensation is grossly normal. Cranial nerves are grossly intact. PSYCH:  Oriented to person, place and time. Affect is normal.  Data Reviewed Previous CT laboratory values were reviewed. I have personally reviewed the patient's imaging, laboratory findings and medical records.    Assessment    She appears to have a postoperative upper GI bleed. With some role to the situation there are multiple possibilities. The most likely include mucosal sloughing following her episode of reversible gastric ischemia or possible bleeding from her gastrotomy site. The  possibility of postoperative gastric or duodenal ulcerations is another option. We would recommend supportive care with resuscitation and blood products possible EGD. I will also recommend transfer to her primary surgical service as soon as can be arranged. We will assist in her management but would prefer not to operate on her for all possible since her previous surgeries have been performed at [facility] This plan was discussed with the patient and her internal medicine physician.    Plan    We would recommend supportive care with resuscitation and blood products possible EGD. I will also recommend transfer to her primary surgical service as soon as can be arranged. We will assist in her management but would prefer not to operate on her for all possible since her previous surgeries have been performed at [facility]. This plan was discussed with the patient and her internal medicine physician.      Time spent with the patient was 25minutes, with more than 50% of the time spent in face-to-face education, counseling and care  coordination.     Dia Crawford III 06/08/2015, 4:50 PM

## 2015-06-09 ENCOUNTER — Inpatient Hospital Stay: Payer: No Typology Code available for payment source

## 2015-06-09 ENCOUNTER — Encounter: Payer: Self-pay | Admitting: Gastroenterology

## 2015-06-09 LAB — CBC
HCT: 29.9 % — ABNORMAL LOW (ref 35.0–47.0)
Hemoglobin: 9.9 g/dL — ABNORMAL LOW (ref 12.0–16.0)
MCH: 27.7 pg (ref 26.0–34.0)
MCHC: 33.2 g/dL (ref 32.0–36.0)
MCV: 83.6 fL (ref 80.0–100.0)
PLATELETS: 419 10*3/uL (ref 150–440)
RBC: 3.57 MIL/uL — ABNORMAL LOW (ref 3.80–5.20)
RDW: 15.8 % — AB (ref 11.5–14.5)
WBC: 7.9 10*3/uL (ref 3.6–11.0)

## 2015-06-09 LAB — TYPE AND SCREEN
ABO/RH(D): A POS
Antibody Screen: NEGATIVE
Unit division: 0
Unit division: 0

## 2015-06-09 LAB — BASIC METABOLIC PANEL
Anion gap: 6 (ref 5–15)
BUN: 23 mg/dL — AB (ref 6–20)
CALCIUM: 7.5 mg/dL — AB (ref 8.9–10.3)
CHLORIDE: 113 mmol/L — AB (ref 101–111)
CO2: 21 mmol/L — AB (ref 22–32)
CREATININE: 0.62 mg/dL (ref 0.44–1.00)
GFR calc non Af Amer: >60 mL/min (ref >60)
GLUCOSE: 103 mg/dL — AB (ref 65–99)
Potassium: 4.3 mmol/L (ref 3.5–5.1)
Sodium: 140 mmol/L (ref 135–145)

## 2015-06-09 NOTE — Consult Note (Signed)
GI Inpatient Consult Note  Reason for Consult: Anemia and hypotension.   Attending Requesting Consult:  History of Present Illness: Mia Gray is a 59 y.o. female who underwent paraesophageal hernia repair two weeks ago at [facility]. Had gastrostomy placed at the time to keep stomach below diaphragm. Admitted yesterday due to melena, blood per G tube, and significant drop in BP and hgb. No prior hx of PUD. Was placed on xarelto for upper extremity DVT. Xarelto stopped and 2 units of PRBC given. Feels much better today.  Past Medical History:  Past Medical History  Diagnosis Date  . Migraine   . Bronchitis   . Hiatal hernia   . Hypertension     Problem List: Patient Active Problem List   Diagnosis Date Noted  . GI bleed 06/08/2015  . Upper GI bleeding   . Hiatal hernia 12/07/2013    Past Surgical History: Past Surgical History  Procedure Laterality Date  . Breast surgery Right     lumpectomy  . Cholecystectomy      [facility]  . Hernia repair Right     x 2  . Hernia repair Left     [facility]  . Haital hernia    . Breast cyst aspiration Right 10+ yrs ago    negative    Allergies: No Known Allergies  Home Medications: Prescriptions prior to admission  Medication Sig Dispense Refill Last Dose  . acetaminophen (TYLENOL) 500 MG tablet Take 1,000 mg by mouth every 6 (six) hours as needed.     Marland Kitchen albuterol (PROVENTIL HFA;VENTOLIN HFA) 108 (90 BASE) MCG/ACT inhaler Inhale 1 puff into the lungs every 6 (six) hours as needed for wheezing or shortness of breath.     Marland Kitchen amitriptyline (ELAVIL) 25 MG tablet Take 50 mg by mouth at bedtime.   06/07/2015 at Unknown time  . cetirizine (ZYRTEC) 10 MG tablet Take 10 mg by mouth daily as needed for allergies.     Marland Kitchen docusate sodium (COLACE) 100 MG capsule Take 100 mg by mouth 2 (two) times daily as needed for mild constipation.     Marland Kitchen ipratropium (ATROVENT HFA) 17 MCG/ACT inhaler Inhale 2 puffs into the lungs every 6 (six) hours as needed for  wheezing.     . metoprolol tartrate (LOPRESSOR) 25 MG tablet Take 12.5 mg by mouth 2 (two) times daily.   06/07/2015 at Unknown time  . ondansetron (ZOFRAN-ODT) 4 MG disintegrating tablet Take 4 mg by mouth every 8 (eight) hours as needed for nausea or vomiting.   06/07/2015 at Unknown time  . Rivaroxaban (XARELTO) 15 MG TABS tablet Take 15 mg by mouth 2 (two) times daily with a meal.   06/07/2015 at Unknown time  . sodium chloride (OCEAN) 0.65 % SOLN nasal spray Place 1 spray into both nostrils as needed for congestion.     . SUMAtriptan (IMITREX) 50 MG tablet Take 50 mg by mouth every 2 (two) hours as needed for migraine. May repeat in 2 hours if headache persists or recurs.      Home medication reconciliation was completed with the patient.   Scheduled Inpatient Medications:   . sodium chloride  10 mL/hr Intravenous Once  . amitriptyline  50 mg Oral QHS  . loratadine  10 mg Oral Daily    Continuous Inpatient Infusions:   . sodium chloride 75 mL/hr at 06/08/15 2014  . pantoprozole (PROTONIX) infusion 8 mg/hr (06/09/15 0054)    PRN Inpatient Medications:  acetaminophen **OR** acetaminophen, albuterol, docusate sodium,  ipratropium, ondansetron **OR** ondansetron (ZOFRAN) IV, sodium chloride  Family History: family history includes Breast cancer in her mother; Cancer in her mother; Stroke in her mother.  The patient's family history is negative for inflammatory bowel disorders, GI malignancy, or solid organ transplantation.  Social History:   reports that she has never smoked. She has never used smokeless tobacco. She reports that she does not drink alcohol or use illicit drugs. The patient denies ETOH, tobacco, or drug use.   Review of Systems: Constitutional: Weight is stable.  Eyes: No changes in vision. ENT: No oral lesions, sore throat.  GI: see HPI.  Heme/Lymph: No easy bruising.  CV: No chest pain.  GU: No hematuria.  Integumentary: No rashes.  Neuro: No headaches.  Psych:  No depression/anxiety.  Endocrine: No heat/cold intolerance.  Allergic/Immunologic: No urticaria.  Resp: No cough, SOB.  Musculoskeletal: No joint swelling.    Physical Examination: BP 130/80 mmHg  Pulse 88  Temp(Src) 97.9 F (36.6 C) (Oral)  Resp 16  Ht 5\' 2"  (1.575 m)  Wt 80 kg (176 lb 5.9 oz)  BMI 32.25 kg/m2  SpO2 97% Gen: NAD, alert and oriented x 4 HEENT: PEERLA, EOMI, Neck: supple, no JVD or thyromegaly Chest: CTA bilaterally, no wheezes, crackles, or other adventitious sounds CV: RRR, no m/g/c/r Abd: soft, NT, G tube in place with some old blood. ND, +BS in all four quadrants; no HSM, guarding, ridigity, or rebound tenderness Ext: no edema, well perfused with 2+ pulses, Skin: no rash or lesions noted Lymph: no LAD  Data: Lab Results  Component Value Date   WBC 7.9 06/09/2015   HGB 9.9* 06/09/2015   HCT 29.9* 06/09/2015   MCV 83.6 06/09/2015   PLT 419 06/09/2015    Recent Labs Lab 06/08/15 1346 06/09/15 0634  HGB 7.9* 9.9*   Lab Results  Component Value Date   NA 140 06/09/2015   K 4.3 06/09/2015   CL 113* 06/09/2015   CO2 21* 06/09/2015   BUN 23* 06/09/2015   CREATININE 0.62 06/09/2015   Lab Results  Component Value Date   ALT 20 06/08/2015   AST 19 06/08/2015   ALKPHOS 103 06/08/2015   BILITOT 0.7 06/08/2015   No results for input(s): APTT, INR, PTT in the last 168 hours. Assessment/Plan: Ms. Ehrler is a 59 y.o. female with UGI bleeding, exacerbated by xarelto use. Bleeding either at G tube site or surgical site. Ischemia possible but pt without any abdominal pain. Stress ulceration post surgery also possible.  Recommendations: Continue supportive care. Expect bleeding to stop once xarelto is held for few more days. Do not recommmend EGD so soon after GI surgery. However, if pt has active bleeding off xarelto, will consider EGD later. Dr. Vira Agar will cover this weekend.  Thank you for the consult. Please call with questions or concerns.  OH,  Lupita Dawn, MD

## 2015-06-09 NOTE — Progress Notes (Signed)
[facility] [place] NAME: Mia Gray    MR#:  [id number]  DATE OF BIRTH:  June 11, 1956  SUBJECTIVE:  CHIEF COMPLAINT:   Chief Complaint  Patient presents with  . GI Bleeding   -feels weak and tired - recent hiatal hernia repair, and was on xarelto for a Right arm clot after a PICC line - admitted for GI bleed- still has some blood coming through gastrostomy tube - received 2units prbc Tx yesterday, hb stable for now  REVIEW OF SYSTEMS:  Review of Systems  Constitutional: Negative for fever and chills.  HENT: Negative for ear discharge and ear pain.   Respiratory: Negative for cough, shortness of breath and wheezing.   Cardiovascular: Negative for chest pain, palpitations and leg swelling.  Gastrointestinal: Positive for nausea and abdominal pain. Negative for vomiting, diarrhea and constipation.  Genitourinary: Negative for dysuria, urgency and frequency.  Neurological: Positive for weakness. Negative for dizziness, sensory change, speech change, focal weakness, seizures and headaches.  Psychiatric/Behavioral: Negative for depression. The patient is not nervous/anxious.     DRUG ALLERGIES:  No Known Allergies  VITALS:  Blood pressure 128/80, pulse 98, temperature 97.9 F (36.6 C), temperature source Oral, resp. rate 23, height 5\' 2"  (1.575 m), weight 80 kg (176 lb 5.9 oz), SpO2 98 %.  PHYSICAL EXAMINATION:  Physical Exam  GENERAL:  59 y.o.-year-old patient lying in the bed with no acute distress.  EYES: Pupils equal, round, reactive to light and accommodation. No scleral icterus. Extraocular muscles intact.  HEENT: Head atraumatic, normocephalic. Oropharynx and nasopharynx clear.  NECK:  Supple, no jugular venous distention. No thyroid enlargement, no tenderness.  LUNGS: Normal breath sounds bilaterally, no wheezing, rales,rhonchi or crepitation. No use of accessory muscles of respiration.  CARDIOVASCULAR: S1, S2 normal.  No murmurs, rubs, or gallops.  ABDOMEN: Soft, nontender, nondistended. Bowel sounds present. No organomegaly or mass.  Gastrostomy tub in place and some bleeding noted EXTREMITIES: No pedal edema, cyanosis, or clubbing.  NEUROLOGIC: Cranial nerves II through XII are intact. Muscle strength 5/5 in all extremities. Sensation intact. Gait not checked.  PSYCHIATRIC: The patient is alert and oriented x 3.  SKIN: No obvious rash, lesion, or ulcer.    LABORATORY PANEL:   CBC  Recent Labs Lab 06/09/15 0634  WBC 7.9  HGB 9.9*  HCT 29.9*  PLT 419   ------------------------------------------------------------------------------------------------------------------  Chemistries   Recent Labs Lab 06/08/15 1346 06/09/15 0634  NA 140 140  K 5.0 4.3  CL 109 113*  CO2 27 21*  GLUCOSE 122* 103*  BUN 16 23*  CREATININE 0.66 0.62  CALCIUM 7.5* 7.5*  AST 19  --   ALT 20  --   ALKPHOS 103  --   BILITOT 0.7  --    ------------------------------------------------------------------------------------------------------------------  Cardiac Enzymes  Recent Labs Lab 06/08/15 1346  TROPONINI <0.03   ------------------------------------------------------------------------------------------------------------------  RADIOLOGY:  No results found.  EKG:   Orders placed or performed during the hospital encounter of 06/08/15  . EKG 12-Lead  . EKG 12-Lead    ASSESSMENT AND PLAN:   59 year old female with past medical history significant for hypertension, migraines, hiatal hernia status post repair about 2 weeks ago presents to the hospital secondary to bleed and hypotension  #1 acute on chronic blood loss anemia-secondary to upper GI bleed. -Patient has had melena and also active bleeding through her gastrostomy tube -Likely secondary to Xarelto. Xarelto has been stopped. -Received 2 units of packed RBC transfusion  yesterday. -Baseline hemoglobin is around 10. Dropped down to 7.9,  after transfusion improved up to 9.9 -Appreciate GI consult. No indication for any EGD especially in light of recent surgery -Conservative management. Support with transfusion as needed. Hold Xarelto and no other blood thinners. -Frequent monitoring of hemoglobin. -Started on clear liquids today. -Also on a Protonix drip. Can be changed over to IV twice a day tomorrow.  #2. Hypotension-likely from hypovolemia. -IV fluids as needed, improved blood pressure. Not on any vasopressors. -Continue to hold her metoprolol  #3 right arm DVT-triggered by PICC line at a different facility. There are alters on hold. -Follow-up ultrasound of the right arm to check the clot burden  #4 history of migraines-continue amitriptyline  #5 DVT prophylaxis-Ted's and SCDs   All the records are reviewed and case discussed with Care Management/Social Workerr. Management plans discussed with the patient, family and they are in agreement.  CODE STATUS: Full Code   TOTAL TIME TAKING CARE OF THIS PATIENT: 37 minutes.   POSSIBLE D/C IN 2 DAYS, DEPENDING ON CLINICAL CONDITION.   Gladstone Lighter M.D on 06/09/2015 at 4:23 PM  Between 7am to 6pm - Pager - [phone]  After 6pm go to [email or web address] - password [email or web address] [facility]  Office  [phone]  CC: Primary care physician; Letta Median, MD

## 2015-06-09 NOTE — Care Management Note (Signed)
Case Management Note  Patient Details  Name: Mia Gray MRN: [id number] Date of Birth: 27-Jul-1956  Subjective/Objective: Admitted with acute GI bleed. Patient placed on Xarelto 2 weeks prior following a PICC line placement and development of upper extremity DVT. She also just had a paraesophageal hernia repair two weeks ago at [facility] with a gastrostomy tube placement.  Met with patient at bedside. She has home health. Spoke with [facility] home health [phone]) and they are providing nursing, PT, OT. She has a walker. WIll follow for progression. It is noted that patient may transfer to her surgeon at [facility]. Will follow                  Action/Plan:   Expected Discharge Date:                  Expected Discharge Plan:  [place]  In-House Referral:     Discharge planning Services  CM Consult  Post Acute Care Choice:    Choice offered to:     DME Arranged:    DME Agency:     HH Arranged:    HH Agency:  [facility]  Status of Service:  In process, will continue to follow  Medicare Important Message Given:    Date Medicare IM Given:    Medicare IM give by:    Date Additional Medicare IM Given:    Additional Medicare Important Message give by:     If discussed at [place] of Stay Meetings, dates discussed:    Additional Comments:  Jolly Mango, RN 06/09/2015, 9:39 AM

## 2015-06-09 NOTE — Progress Notes (Signed)
Called by radiologist that patient's right upper extremity US + for DVT in the axillary and also 2 brachial veins.  Pt. Here w/ GI bleed and cannot anticoagulate.  Was put on Xarelto for PICC associated DVT at [facility] and it has been stopped due to GI bleed.   - will order Vascular surgery consult for further input.

## 2015-06-10 LAB — BASIC METABOLIC PANEL
Anion gap: 7 (ref 5–15)
BUN: 9 mg/dL (ref 6–20)
CALCIUM: 7.8 mg/dL — AB (ref 8.9–10.3)
CO2: 21 mmol/L — AB (ref 22–32)
CREATININE: 0.61 mg/dL (ref 0.44–1.00)
Chloride: 113 mmol/L — ABNORMAL HIGH (ref 101–111)
GLUCOSE: 109 mg/dL — AB (ref 65–99)
POTASSIUM: 3.7 mmol/L (ref 3.5–5.1)
SODIUM: 141 mmol/L (ref 135–145)

## 2015-06-10 LAB — CBC
HEMATOCRIT: 34.2 % — AB (ref 35.0–47.0)
HEMOGLOBIN: 11.2 g/dL — AB (ref 12.0–16.0)
MCH: 27.8 pg (ref 26.0–34.0)
MCHC: 32.7 g/dL (ref 32.0–36.0)
MCV: 84.8 fL (ref 80.0–100.0)
Platelets: 443 10*3/uL — ABNORMAL HIGH (ref 150–440)
RBC: 4.03 MIL/uL (ref 3.80–5.20)
RDW: 16.3 % — ABNORMAL HIGH (ref 11.5–14.5)
WBC: 7.5 10*3/uL (ref 3.6–11.0)

## 2015-06-10 NOTE — Consult Note (Signed)
[facility] Consult Note  MRN : [id number]  Mia Gray is a 59 y.o. (1956-09-04) female who presents with chief complaint of  Chief Complaint  Patient presents with  . GI Bleeding   History of Present Illness:  Mia Gray a 59 y.o. 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 After an appropriate workup the patient was found to have an upper GI bleed.   As per the patient, she was placed on Xarelto for the rigtht upper extremity DVT she sustained after PICC placement.   Venous duplex exam of the upper extremity yesterday was notable for evidence of acute occlusive thrombus within the right axillary vein. Two brachial veins over the upper arm, 1 of which is patent and the other of which contains occlusive thrombus which may be acute or chronic.  Vascular surgery was consulted for recommendations.   Current Facility-Administered Medications  Medication Dose Route Frequency Provider Last Rate Last Dose  . 0.9 %  sodium chloride infusion  10 mL/hr Intravenous Once Lisa Roca, MD      . 0.9 %  sodium chloride infusion   Intravenous Continuous Henreitta Leber, MD 75 mL/hr at 06/10/15 [phone]    . acetaminophen (TYLENOL) tablet 650 mg  650 mg Oral Q6H PRN Henreitta Leber, MD   650 mg at 06/08/15 2014   Or  . acetaminophen (TYLENOL) suppository 650 mg  650 mg Rectal Q6H PRN Henreitta Leber, MD      . albuterol (PROVENTIL) (2.5 MG/3ML) 0.083% nebulizer solution 2.5 mg  2.5 mg Inhalation Q6H PRN Henreitta Leber, MD      . amitriptyline (ELAVIL) tablet 50 mg  50 mg Oral QHS Henreitta Leber, MD   50 mg at 06/09/15 2203  . docusate sodium (COLACE) capsule 100 mg  100 mg Oral BID PRN Henreitta Leber, MD      . ipratropium (ATROVENT) nebulizer  solution 0.5 mg  2.5 mL Inhalation Q6H PRN Henreitta Leber, MD      . loratadine (CLARITIN) tablet 10 mg  10 mg Oral Daily Henreitta Leber, MD   10 mg at 06/10/15 0905  . ondansetron (ZOFRAN) tablet 4 mg  4 mg Oral Q6H PRN Henreitta Leber, MD       Or  . ondansetron (ZOFRAN) injection 4 mg  4 mg Intravenous Q6H PRN Henreitta Leber, MD      . pantoprazole (PROTONIX) 80 mg in sodium chloride 0.9 % 250 mL (0.32 mg/mL) infusion  8 mg/hr Intravenous Continuous Lisa Roca, MD 25 mL/hr at 06/10/15 0424 8 mg/hr at 06/10/15 0424  . sodium chloride (OCEAN) 0.65 % nasal spray 1 spray  1 spray Each Nare PRN Henreitta Leber, MD        Past Medical History  Diagnosis Date  . Migraine   . Bronchitis   . Hiatal hernia   . Hypertension     Past Surgical History  Procedure Laterality Date  . Breast surgery Right     lumpectomy  . Cholecystectomy      [facility]  . Hernia repair Right     x 2  . Hernia repair Left     [facility]  . Haital hernia    . Breast cyst aspiration Right 10+ yrs ago  negative    Social History Social History  Substance Use Topics  . Smoking status: Never Smoker   . Smokeless tobacco: Never Used  . Alcohol Use: No    Family History Family History  Problem Relation Age of Onset  . Cancer Mother     breast and colon  . Stroke Mother   . Breast cancer Mother     No Known Allergies   REVIEW OF SYSTEMS (Negative unless checked)  Constitutional: [] Weight loss  [] Fever  [] Chills Cardiac: [] Chest pain   [] Chest pressure   [] Palpitations   [] Shortness of breath when laying flat   [] Shortness of breath at rest   [] Shortness of breath with exertion. Vascular:  [] Pain in legs with walking   [] Pain in legs at rest   [] Pain in legs when laying flat   [] Claudication   [] Pain in feet when walking  [] Pain in feet at rest  [] Pain in feet when laying flat   [x] History of DVT   [] Phlebitis   [] Swelling in legs   [] Varicose veins   [] Non-healing ulcers Pulmonary:   [] Uses home  oxygen   [] Productive cough   [] Hemoptysis   [] Wheeze  [] COPD   [] Asthma Neurologic:  [x] Dizziness  [] Blackouts   [] Seizures   [] History of stroke   [] History of TIA  [] Aphasia   [] Temporary blindness   [] Dysphagia   [] Weakness or numbness in arms   [] Weakness or numbness in legs Musculoskeletal:  [] Arthritis   [] Joint swelling   [] Joint pain   [] Low back pain Hematologic:  [] Easy bruising  [] Easy bleeding   [] Hypercoagulable state   [] Anemic  [] Hepatitis Gastrointestinal:  [x] Blood in stool   [] Vomiting blood  [x] Gastroesophageal reflux/heartburn   [] Difficulty swallowing. Genitourinary:  [] Chronic kidney disease   [] Difficult urination  [] Frequent urination  [] Burning with urination   [] Blood in urine Skin:  [] Rashes   [] Ulcers   [] Wounds Psychological:  [] History of anxiety   []  History of major depression.  Physical Examination  Filed Vitals:   06/09/15 1830 06/09/15 2132 06/10/15 0557 06/10/15 1232  BP: 140/86 135/82 127/77 117/69  Pulse: 100 104 97 101  Temp:  98.2 F (36.8 C) 97.8 F (36.6 C) 99.1 F (37.3 C)  TempSrc:  Oral Oral Axillary  Resp: 18 16 24 18   Height:      Weight:      SpO2: 100% 98% 97% 99%   Body mass index is 32.25 kg/(m^2).   Gen:  WD/WN, NAD Head: [place]/AT, No temporalis wasting. Prominent temp pulse not noted. Ear/Nose/Throat: Hearing grossly intact, nares w/o erythema or drainage, oropharynx w/o Erythema/Exudate Eyes: PERRLA, EOMI.  Neck: Supple, no nuchal rigidity.  No bruit or JVD.  Pulmonary:  Good air movement, clear to auscultation bilaterally.  Cardiac: RRR, normal S1, S2, no Murmurs, rubs or gallops. Vascular:  Vessel Right Left  Radial Palpable Palpable  Ulnar Palpable Palpable  Brachial Palpable Palpable  Carotid Palpable, without bruit Palpable, without bruit  Aorta Not palpable N/A  Femoral Palpable Palpable  Popliteal Palpable Palpable  PT Palpable Palpable  DP Palpable Palpable   Right Upper Extremity: Upper arm soft, lower arm,  soft, minimal swelling in upper extremity, brachial, radial and ulnar pulses present, hand / fingers warm, no ulcerations present.  Gastrointestinal: PEG tube present. soft, non-tender/non-distended. No guarding/reflex. No masses. Musculoskeletal: M/S 5/5 throughout.  Extremities without ischemic changes.  No deformity or atrophy. No edema. Neurologic: CN 2-12 intact. Pain and light touch intact in extremities.  Symmetrical.  Speech  is fluent. Motor exam as listed above. Psychiatric: Judgment intact, Mood & affect appropriate for pt's clinical situation. Dermatologic: No rashes or ulcers noted.  No cellulitis or open wounds. Lymph : No Cervical, Axillary, or Inguinal lymphadenopathy.  CBC Lab Results  Component Value Date   WBC 7.5 06/10/2015   HGB 11.2* 06/10/2015   HCT 34.2* 06/10/2015   MCV 84.8 06/10/2015   PLT 443* 06/10/2015   BMET    Component Value Date/Time   NA 141 06/10/2015 0632   K 3.7 06/10/2015 0632   CL 113* 06/10/2015 0632   CO2 21* 06/10/2015 0632   GLUCOSE 109* 06/10/2015 0632   BUN 9 06/10/2015 0632   CREATININE 0.61 06/10/2015 0632   CALCIUM 7.8* 06/10/2015 0632   GFRNONAA >60 06/10/2015 0632   GFRAA >60 06/10/2015 1062   Estimated Creatinine Clearance: 72.4 mL/min (by C-G formula based on Cr of 0.61).  COAG No results found for: INR, PROTIME  Radiology Dg Abd 1 View  05/26/2015   CLINICAL DATA:  Nasogastric tube placement.  Initial encounter.  EXAM: ABDOMEN - 1 VIEW  COMPARISON:  CT of the abdomen and pelvis performed earlier today at 1:23 a.m.  FINDINGS: The patient's enteric tube is noted coiled overlying the distal esophagus, extending back into the mid esophagus. This could be retracted and readvanced, as deemed clinically appropriate. Note that the gastroesophageal junction may be difficult to pass due to the rotation and herniation of the stomach.  The stomach is distended and herniated above the left hemidiaphragm, as described on CT. Contrast is  seen within the renal calyces. Clips are noted within the right upper quadrant, reflecting prior cholecystectomy. No acute osseous abnormalities are identified.  IMPRESSION: Enteric tube noted coiled overlying the distal esophagus, extending back into the mid esophagus. This could be retracted and readvanced, as deemed clinically appropriate. Note that the gastroesophageal junction may be difficult to pass with the tube, due to the rotation and herniation of the stomach.  These results were called by telephone at the time of interpretation on 05/26/2015 at 3:31 am to Dr. Harvest Dark, who verbally acknowledged these results.   Electronically Signed   By: Garald Balding M.D.   On: 05/26/2015 03:31   Ct Abdomen Pelvis W Contrast  05/26/2015   CLINICAL DATA:  Upper abdominal pain. History of hiatal hernia repair.  EXAM: CT ABDOMEN AND PELVIS WITH CONTRAST  TECHNIQUE: Multidetector CT imaging of the abdomen and pelvis was performed using the standard protocol following bolus administration of intravenous contrast.  CONTRAST:  172mL OMNIPAQUE IOHEXOL 300 MG/ML  SOLN  COMPARISON:  12/06/2008  FINDINGS: There is a very large diaphragmatic hernia with essentially the entire stomach above the diaphragm. This appears to be a paraesophageal hernia, with the EG junction just below the diaphragmatic hiatus and the entire stomach rotated above the diaphragm. The stomach is markedly distended and only a trace amount of oral contrast has moved below the diaphragm into the duodenum, seen on coronal imaging. There is no pneumatosis or extraluminal air. The esophagus is markedly distended with oral contrast and air. There is mild atelectatic appearing opacity in the lung bases adjacent to the large hernia.  There is a an unremarkable appearance of the remainder of the bowel except for moderate uncomplicated colonic diverticulosis. There is cholecystectomy. The liver and bile ducts appear normal. The pancreas, spleen, adrenals and  kidneys appear unremarkable. Uterus and ovaries appear unremarkable.  IMPRESSION: Very large diaphragmatic hernia which is likely paraesophageal, with the  stomach rotated above the diaphragm. The stomach and esophagus are markedly distended. Only a trace of oral contrast passes below the diaphragm into the proximal duodenum.   Electronically Signed   By: Andreas Newport M.D.   On: 05/26/2015 01:59   US Venous Img Upper Uni Right  06/09/2015   ADDENDUM REPORT: 06/09/2015 18:24 ADDENDUM: These results were called by telephone at the time of interpretation on 06/09/2015 at 6:24 pm to Dr. Verdell Carmine , who verbally acknowledged these results. Electronically Signed   By: Marin Olp M.D.   On: 06/09/2015 18:24  06/09/2015   CLINICAL DATA:  Right upper extremity swelling. Previous history of right upper extremity DVT. PICC line placed 6 days ago.  EXAM: Right UPPER EXTREMITY VENOUS DOPPLER ULTRASOUND  TECHNIQUE: Gray-scale sonography with graded compression, as well as color Doppler and duplex ultrasound were performed to evaluate the upper extremity deep venous system from the level of the subclavian vein and including the jugular, axillary, basilic, radial, ulnar and upper cephalic vein. Spectral Doppler was utilized to evaluate flow at rest and with distal augmentation maneuvers.  COMPARISON:  None.  FINDINGS: Contralateral Subclavian Vein: Respiratory phasicity is normal and symmetric with the symptomatic side. No evidence of thrombus. Normal compressibility.  Internal Jugular Vein: No evidence of thrombus. Normal compressibility, respiratory phasicity and response to augmentation.  Subclavian Vein: No evidence of thrombus. Normal compressibility, respiratory phasicity and response to augmentation.  Axillary Vein: Evidence of echogenic intraluminal occlusive noncompressible thrombus with mild vessel expansion likely acute thrombus.  Cephalic Vein: No evidence of thrombus. Normal compressibility, respiratory  phasicity and response to augmentation.  Basilic Vein: No evidence of thrombus. Normal compressibility, respiratory phasicity and response to augmentation.  Brachial Veins: Evidence of intraluminal noncompressible occlusive thrombus within 1 of 2 brachial veins visualized as this may be acute or chronic. The other brachial vein is patent.  Radial Veins: No evidence of thrombus. Normal compressibility, respiratory phasicity and response to augmentation.  Ulnar Veins: No evidence of thrombus. Normal compressibility, respiratory phasicity and response to augmentation.  Venous Reflux:  None visualized.  Other Findings:  None visualized.  IMPRESSION: Evidence of acute occlusive thrombus within the right axillary vein. Two brachial veins over the upper arm, 1 of which is patent and the other of which contains occlusive thrombus which may be acute or chronic.  Electronically Signed: By: Marin Olp M.D. On: 06/09/2015 17:53   Dg Chest Portable 1 View  05/25/2015   CLINICAL DATA:  Upper abdominal pain.  EXAM: PORTABLE CHEST - 1 VIEW  COMPARISON:  11/13/2013  FINDINGS: There is unchanged left hemidiaphragm elevation. There is mild linear left base atelectatic appearing opacity above the elevated hemidiaphragm. The right lung is clear. Pulmonary vasculature is normal. No large effusions.  IMPRESSION: No acute findings.  Stable elevated left hemidiaphragm.   Electronically Signed   By: Andreas Newport M.D.   On: 05/25/2015 23:17   Assessment/Plan Mia Gray a 59 y.o. female with a known history of hiatal hernia, migraines, hypertension, recent surgery for an incarcerated paraesophageal hernia, status post PEG tube placement, upper extremity DVT secondary to PICC line placement who presents to ED with upper GI bleed.   1) The occurrence of a DVT in the upper extremity to embolize is rare, would normally recommend ASA as anticoagulation, in the setting of GI bleed no oral anticoagulation is recommended. 2) No  acute vascular compromise to patients extremity on physical exam.  3) No indication for filter placement. 4) Patient can  follow up in our office once her acute issues have resolved. 5) Discussed with Dr. Mayme Genta, PA-C  06/10/2015 1:28 PM

## 2015-06-10 NOTE — Progress Notes (Signed)
[facility] [place] NAME: Mia Gray    MR#:  [id number]  DATE OF BIRTH:  March 06, 1956  SUBJECTIVE:  CHIEF COMPLAINT:   Chief Complaint  Patient presents with  . GI Bleeding   - feels weak and tired - recent hiatal hernia repair, and was on xarelto for a Right arm clot after a PICC line - admitted for GI bleed- still has some blood coming through gastrostomy tube - received 2units prbc Tx , hb stable for now - dark stool, but less than before, on liquid diet, no pain/ nausea.  REVIEW OF SYSTEMS:  Review of Systems  Constitutional: Negative for fever and chills.  HENT: Negative for ear discharge and ear pain.   Respiratory: Negative for cough, shortness of breath and wheezing.   Cardiovascular: Negative for chest pain, palpitations and leg swelling.  Gastrointestinal: Negative for nausea, vomiting, abdominal pain, diarrhea and constipation.  Genitourinary: Negative for dysuria, urgency and frequency.  Neurological: Positive for weakness. Negative for dizziness, sensory change, speech change, focal weakness, seizures and headaches.  Psychiatric/Behavioral: Negative for depression. The patient is not nervous/anxious.     DRUG ALLERGIES:  No Known Allergies  VITALS:  Blood pressure 117/69, pulse 101, temperature 99.1 F (37.3 C), temperature source Axillary, resp. rate 18, height 5\' 2"  (1.575 m), weight 80 kg (176 lb 5.9 oz), SpO2 99 %.  PHYSICAL EXAMINATION:  Physical Exam  GENERAL:  59 y.o.-year-old patient lying in the bed with no acute distress.  EYES: Pupils equal, round, reactive to light and accommodation. No scleral icterus. Extraocular muscles intact.  HEENT: Head atraumatic, normocephalic. Oropharynx and nasopharynx clear.  NECK:  Supple, no jugular venous distention. No thyroid enlargement, no tenderness.  LUNGS: Normal breath sounds bilaterally, no wheezing, rales,rhonchi or crepitation. No use of accessory  muscles of respiration.  CARDIOVASCULAR: S1, S2 normal. No murmurs, rubs, or gallops.  ABDOMEN: Soft, nontender, nondistended. Bowel sounds present. No organomegaly or mass.  Gastrostomy tub in place and some bleeding noted EXTREMITIES: No pedal edema, cyanosis, or clubbing.  NEUROLOGIC: Cranial nerves II through XII are intact. Muscle strength 5/5 in all extremities. Sensation intact. Gait not checked.  PSYCHIATRIC: The patient is alert and oriented x 3.  SKIN: No obvious rash, lesion, or ulcer.    LABORATORY PANEL:   CBC  Recent Labs Lab 06/10/15 0632  WBC 7.5  HGB 11.2*  HCT 34.2*  PLT 443*   ------------------------------------------------------------------------------------------------------------------  Chemistries   Recent Labs Lab 06/08/15 1346  06/10/15 0632  NA 140  < > 141  K 5.0  < > 3.7  CL 109  < > 113*  CO2 27  < > 21*  GLUCOSE 122*  < > 109*  BUN 16  < > 9  CREATININE 0.66  < > 0.61  CALCIUM 7.5*  < > 7.8*  AST 19  --   --   ALT 20  --   --   ALKPHOS 103  --   --   BILITOT 0.7  --   --   < > = values in this interval not displayed. ------------------------------------------------------------------------------------------------------------------  Cardiac Enzymes  Recent Labs Lab 06/08/15 1346  TROPONINI <0.03   ------------------------------------------------------------------------------------------------------------------  RADIOLOGY:  US Venous Img Upper Uni Right  06/09/2015   ADDENDUM REPORT: 06/09/2015 18:24 ADDENDUM: These results were called by telephone at the time of interpretation on 06/09/2015 at 6:24 pm to Dr. Verdell Carmine , who verbally acknowledged these results. Electronically Signed  By: Marin Olp M.D.   On: 06/09/2015 18:24  06/09/2015   CLINICAL DATA:  Right upper extremity swelling. Previous history of right upper extremity DVT. PICC line placed 6 days ago.  EXAM: Right UPPER EXTREMITY VENOUS DOPPLER ULTRASOUND  TECHNIQUE:  Gray-scale sonography with graded compression, as well as color Doppler and duplex ultrasound were performed to evaluate the upper extremity deep venous system from the level of the subclavian vein and including the jugular, axillary, basilic, radial, ulnar and upper cephalic vein. Spectral Doppler was utilized to evaluate flow at rest and with distal augmentation maneuvers.  COMPARISON:  None.  FINDINGS: Contralateral Subclavian Vein: Respiratory phasicity is normal and symmetric with the symptomatic side. No evidence of thrombus. Normal compressibility.  Internal Jugular Vein: No evidence of thrombus. Normal compressibility, respiratory phasicity and response to augmentation.  Subclavian Vein: No evidence of thrombus. Normal compressibility, respiratory phasicity and response to augmentation.  Axillary Vein: Evidence of echogenic intraluminal occlusive noncompressible thrombus with mild vessel expansion likely acute thrombus.  Cephalic Vein: No evidence of thrombus. Normal compressibility, respiratory phasicity and response to augmentation.  Basilic Vein: No evidence of thrombus. Normal compressibility, respiratory phasicity and response to augmentation.  Brachial Veins: Evidence of intraluminal noncompressible occlusive thrombus within 1 of 2 brachial veins visualized as this may be acute or chronic. The other brachial vein is patent.  Radial Veins: No evidence of thrombus. Normal compressibility, respiratory phasicity and response to augmentation.  Ulnar Veins: No evidence of thrombus. Normal compressibility, respiratory phasicity and response to augmentation.  Venous Reflux:  None visualized.  Other Findings:  None visualized.  IMPRESSION: Evidence of acute occlusive thrombus within the right axillary vein. Two brachial veins over the upper arm, 1 of which is patent and the other of which contains occlusive thrombus which may be acute or chronic.  Electronically Signed: By: Marin Olp M.D. On: 06/09/2015  17:53    EKG:   Orders placed or performed during the hospital encounter of 06/08/15  . EKG 12-Lead  . EKG 12-Lead    ASSESSMENT AND PLAN:   60 year old female with past medical history significant for hypertension, migraines, hiatal hernia status post repair about 2 weeks ago presents to the hospital secondary to bleed and hypotension  #1 acute on chronic blood loss anemia-secondary to upper GI bleed. -Patient has had melena and also active bleeding through her gastrostomy tube -Likely secondary to Xarelto. stopped. -Received 2 units of packed RBC transfusion - Hb stable now. -Appreciate GI consult. No indication for any EGD especially in light of recent surgery -Conservative management. Support with transfusion as needed. Hold Xarelto and no other blood thinners. -Frequent monitoring of hemoglobin. -Started on clear liquids today. -Also on a Protonix drip. Can be changed over to IV twice a day tomorrow.  #2. Hypotension-likely from hypovolemia. -IV fluids as needed, improved blood pressure. Not on any vasopressors. -Continue to hold her metoprolol - BP stable now.  #3 right arm DVT-triggered by PICC line at a different facility.  - repeat Doppler - still shows DVT- appreciated vascular consult. - No anticoagulation, and advised to follow in office in 1 month.  #4 history of migraines-continue amitriptyline  #5 DVT prophylaxis-Ted's and SCDs   All the records are reviewed and case discussed with Care Management/Social Workerr. Management plans discussed with the patient, family and they are in agreement.  CODE STATUS: Full Code   TOTAL TIME TAKING CARE OF THIS PATIENT: 35 minutes.   POSSIBLE D/C IN 2 DAYS, DEPENDING ON  CLINICAL CONDITION.   Vaughan Basta M.D on 06/10/2015 at 3:40 PM  Between 7am to 6pm - Pager - [phone]  After 6pm go to [email or web address] - password [email or web address] [facility]  Office  [phone]  CC: Primary care  physician; Letta Median, MD

## 2015-06-10 NOTE — Consult Note (Signed)
PT with recent surgery laproscopicly at [facility] for paraesophageal hernia  2 weeks ago. Had blood and melena from PEG tube.  Hgb up after transfusions7.9-9.9-11.2  plt 443, creat 0.61, bun 9  Doppler showed DVT in axillary and brachial veins but due to GI bleed can''t be on blood thinners.  BP 127/77, P 97, R 24, sat 97%,  Breathing ok today, color looks good, abd surgical ports healing well, bowel sounds good.  She reports the stool is less black.  Appears to be making progress.  No EGD unless active bleeding.  She requests a note for work and why she will be out longer than expected.

## 2015-06-11 LAB — CBC
HEMATOCRIT: 29 % — AB (ref 35.0–47.0)
HEMOGLOBIN: 9.7 g/dL — AB (ref 12.0–16.0)
MCH: 27.9 pg (ref 26.0–34.0)
MCHC: 33.5 g/dL (ref 32.0–36.0)
MCV: 83.4 fL (ref 80.0–100.0)
Platelets: 358 10*3/uL (ref 150–440)
RBC: 3.48 MIL/uL — ABNORMAL LOW (ref 3.80–5.20)
RDW: 16.2 % — AB (ref 11.5–14.5)
WBC: 4.3 10*3/uL (ref 3.6–11.0)

## 2015-06-11 MED ORDER — PANTOPRAZOLE SODIUM 40 MG IV SOLR
40.0000 mg | Freq: Two times a day (BID) | INTRAVENOUS | Status: DC
  Administered 2015-06-11 – 2015-06-12 (×3): 40 mg via INTRAVENOUS
  Filled 2015-06-11 (×3): qty 40

## 2015-06-11 MED ORDER — ENSURE ENLIVE PO LIQD
237.0000 mL | Freq: Three times a day (TID) | ORAL | Status: DC
  Administered 2015-06-11 – 2015-06-12 (×2): 237 mL via ORAL
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
## 2015-06-11 NOTE — Progress Notes (Signed)
[facility] [place] NAME: Mia Gray    MR#:  [id number]  DATE OF BIRTH:  11/04/1955  SUBJECTIVE:  CHIEF COMPLAINT:   Chief Complaint  Patient presents with  . GI Bleeding   - feels weak and tired - recent hiatal hernia repair, and was on xarelto for a Right arm clot after a PICC line - admitted for GI bleed- still has some blood coming through gastrostomy tube - received 2units prbc Tx , hb stable for now - dark stool, but less than before, on liquid diet, no pain/ nausea.  REVIEW OF SYSTEMS:  Review of Systems  Constitutional: Negative for fever and chills.  HENT: Negative for ear discharge and ear pain.   Respiratory: Negative for cough, shortness of breath and wheezing.   Cardiovascular: Negative for chest pain, palpitations and leg swelling.  Gastrointestinal: Negative for nausea, vomiting, abdominal pain, diarrhea and constipation.  Genitourinary: Negative for dysuria, urgency and frequency.  Neurological: Positive for weakness. Negative for dizziness, sensory change, speech change, focal weakness, seizures and headaches.  Psychiatric/Behavioral: Negative for depression. The patient is not nervous/anxious.     DRUG ALLERGIES:  No Known Allergies  VITALS:  Blood pressure 126/64, pulse 100, temperature 98.5 F (36.9 C), temperature source Oral, resp. rate 18, height 5\' 2"  (1.575 m), weight 80 kg (176 lb 5.9 oz), SpO2 98 %.  PHYSICAL EXAMINATION:  Physical Exam  GENERAL:  59 y.o.-year-old patient lying in the bed with no acute distress.  EYES: Pupils equal, round, reactive to light and accommodation. No scleral icterus. Extraocular muscles intact.  HEENT: Head atraumatic, normocephalic. Oropharynx and nasopharynx clear.  NECK:  Supple, no jugular venous distention. No thyroid enlargement, no tenderness.  LUNGS: Normal breath sounds bilaterally, no wheezing, rales,rhonchi or crepitation. No use of accessory muscles  of respiration.  CARDIOVASCULAR: S1, S2 normal. No murmurs, rubs, or gallops.  ABDOMEN: Soft, nontender, nondistended. Bowel sounds present. No organomegaly or mass.  Gastrostomy tub in place and some bleeding noted EXTREMITIES: No pedal edema, cyanosis, or clubbing.  NEUROLOGIC: Cranial nerves II through XII are intact. Muscle strength 5/5 in all extremities. Sensation intact. Gait not checked.  PSYCHIATRIC: The patient is alert and oriented x 3.  SKIN: No obvious rash, lesion, or ulcer.    LABORATORY PANEL:   CBC  Recent Labs Lab 06/11/15 0543  WBC 4.3  HGB 9.7*  HCT 29.0*  PLT 358   ------------------------------------------------------------------------------------------------------------------  Chemistries   Recent Labs Lab 06/08/15 1346  06/10/15 0632  NA 140  < > 141  K 5.0  < > 3.7  CL 109  < > 113*  CO2 27  < > 21*  GLUCOSE 122*  < > 109*  BUN 16  < > 9  CREATININE 0.66  < > 0.61  CALCIUM 7.5*  < > 7.8*  AST 19  --   --   ALT 20  --   --   ALKPHOS 103  --   --   BILITOT 0.7  --   --   < > = values in this interval not displayed. ------------------------------------------------------------------------------------------------------------------  Cardiac Enzymes  Recent Labs Lab 06/08/15 1346  TROPONINI <0.03   ------------------------------------------------------------------------------------------------------------------  RADIOLOGY:  US Venous Img Upper Uni Right  06/09/2015   ADDENDUM REPORT: 06/09/2015 18:24 ADDENDUM: These results were called by telephone at the time of interpretation on 06/09/2015 at 6:24 pm to Dr. Verdell Carmine , who verbally acknowledged these results. Electronically Signed  By: Marin Olp M.D.   On: 06/09/2015 18:24  06/09/2015   CLINICAL DATA:  Right upper extremity swelling. Previous history of right upper extremity DVT. PICC line placed 6 days ago.  EXAM: Right UPPER EXTREMITY VENOUS DOPPLER ULTRASOUND  TECHNIQUE: Gray-scale  sonography with graded compression, as well as color Doppler and duplex ultrasound were performed to evaluate the upper extremity deep venous system from the level of the subclavian vein and including the jugular, axillary, basilic, radial, ulnar and upper cephalic vein. Spectral Doppler was utilized to evaluate flow at rest and with distal augmentation maneuvers.  COMPARISON:  None.  FINDINGS: Contralateral Subclavian Vein: Respiratory phasicity is normal and symmetric with the symptomatic side. No evidence of thrombus. Normal compressibility.  Internal Jugular Vein: No evidence of thrombus. Normal compressibility, respiratory phasicity and response to augmentation.  Subclavian Vein: No evidence of thrombus. Normal compressibility, respiratory phasicity and response to augmentation.  Axillary Vein: Evidence of echogenic intraluminal occlusive noncompressible thrombus with mild vessel expansion likely acute thrombus.  Cephalic Vein: No evidence of thrombus. Normal compressibility, respiratory phasicity and response to augmentation.  Basilic Vein: No evidence of thrombus. Normal compressibility, respiratory phasicity and response to augmentation.  Brachial Veins: Evidence of intraluminal noncompressible occlusive thrombus within 1 of 2 brachial veins visualized as this may be acute or chronic. The other brachial vein is patent.  Radial Veins: No evidence of thrombus. Normal compressibility, respiratory phasicity and response to augmentation.  Ulnar Veins: No evidence of thrombus. Normal compressibility, respiratory phasicity and response to augmentation.  Venous Reflux:  None visualized.  Other Findings:  None visualized.  IMPRESSION: Evidence of acute occlusive thrombus within the right axillary vein. Two brachial veins over the upper arm, 1 of which is patent and the other of which contains occlusive thrombus which may be acute or chronic.  Electronically Signed: By: Marin Olp M.D. On: 06/09/2015 17:53     EKG:   Orders placed or performed during the hospital encounter of 06/08/15  . EKG 12-Lead  . EKG 12-Lead    ASSESSMENT AND PLAN:   59 year old female with past medical history significant for hypertension, migraines, hiatal hernia status post repair about 2 weeks ago presents to the hospital secondary to bleed and hypotension  #1 acute on chronic blood loss anemia-secondary to upper GI bleed. -Patient has had melena and also active bleeding through her gastrostomy tube -Likely secondary to Xarelto. stopped. -Received 2 units of packed RBC transfusion - Hb stable now. -Appreciate GI consult. No indication for any EGD especially in light of recent surgery -Conservative management. Support with transfusion as needed. Hold Xarelto and no other blood thinners. -Frequent monitoring of hemoglobin. -Started on clear liquids tolerating, slow upgrade per GI. -Also on a Protonix drip. changed over to IV twice a day .  #2. Hypotension-likely from hypovolemia. -IV fluids as needed, improved blood pressure. Not on any vasopressors. -Continue to hold her metoprolol - BP stable now.  #3 right arm DVT-triggered by PICC line at a different facility in recent past.  - repeat Doppler - still shows DVT- appreciated vascular consult. - No anticoagulation, and advised to follow in office in 1 month.  #4 history of migraines-continue amitriptyline  #5 DVT prophylaxis-Ted's and SCDs   All the records are reviewed and case discussed with Care Management/Social Workerr. Management plans discussed with the patient, family and they are in agreement.  CODE STATUS: Full Code   TOTAL TIME TAKING CARE OF THIS PATIENT: 35 minutes.   POSSIBLE D/C  IN 1-2 DAYS, DEPENDING ON CLINICAL CONDITION ad when GI clears for d/c.   Vaughan Basta M.D on 06/11/2015 at 2:45 PM  Between 7am to 6pm - Pager - [phone]  After 6pm go to [email or web address] - password [email or web address] [facility]   Office  [phone]  CC: Primary care physician; Letta Median, MD

## 2015-06-12 LAB — CBC
HCT: 31.7 % — ABNORMAL LOW (ref 35.0–47.0)
Hemoglobin: 10.4 g/dL — ABNORMAL LOW (ref 12.0–16.0)
MCH: 27.7 pg (ref 26.0–34.0)
MCHC: 32.8 g/dL (ref 32.0–36.0)
MCV: 84.3 fL (ref 80.0–100.0)
PLATELETS: 361 10*3/uL (ref 150–440)
RBC: 3.75 MIL/uL — AB (ref 3.80–5.20)
RDW: 16.4 % — ABNORMAL HIGH (ref 11.5–14.5)
WBC: 4.9 10*3/uL (ref 3.6–11.0)

## 2015-06-12 MED ORDER — PANTOPRAZOLE SODIUM 40 MG PO TBEC: 40.0000 mg | DELAYED_RELEASE_TABLET | Freq: Two times a day (BID) | ORAL | Status: DC

## 2015-06-12 MED ORDER — ACETAMINOPHEN 325 MG PO TABS: 650.0000 mg | ORAL_TABLET | Freq: Four times a day (QID) | ORAL | Status: DC | PRN

## 2015-06-12 MED ORDER — ACETAMINOPHEN 500 MG PO TABS: 500.0000 mg | ORAL_TABLET | Freq: Four times a day (QID) | ORAL | Status: DC | PRN

## 2015-06-12 MED ORDER — ACETAMINOPHEN 325 MG PO TABS: 650.0000 mg | ORAL_TABLET | Freq: Two times a day (BID) | ORAL | Status: AC | PRN

## 2015-06-12 MED ORDER — ENSURE ENLIVE PO LIQD: 237.0000 mL | Freq: Three times a day (TID) | ORAL | Status: DC

## 2015-06-12 NOTE — Progress Notes (Signed)
Subjective:  59 year old female admitted for GI bleed secondary to surgery and anticoagulant usage. Patient has improved over the weekend with no current complaints of abdominal pain, melena, nausea, blood output per G-tube. She has been tolerating an oral diet. She states she is much improved from admission.  Vital signs in last 24 hours: Temp:  [98.2 F (36.8 C)-98.6 F (37 C)] 98.2 F (36.8 C) (09/19 0548) Pulse Rate:  [96-102] 96 (09/19 0548) Resp:  [16-18] 16 (09/19 0548) BP: (126-128)/(64-80) 127/70 mmHg (09/19 0548) SpO2:  [94 %-98 %] 94 % (09/19 0548) Last BM Date: 06/11/15  Intake/Output from previous day: 09/18 0701 - 09/19 0700 In: 1718 [P.O.:840; I.V.:878] Out: 0   Exam:  Abdomen is soft, nontender, nondistended. G-tube present in the left upper quadrant without any bloody output.  Lab Results:  CBC  Recent Labs  06/11/15 0543 06/12/15 0932  WBC 4.3 4.9  HGB 9.7* 10.4*  HCT 29.0* 31.7*  PLT 358 361   CMP     Component Value Date/Time   NA 141 06/10/2015 0632   K 3.7 06/10/2015 0632   CL 113* 06/10/2015 0632   CO2 21* 06/10/2015 0632   GLUCOSE 109* 06/10/2015 0632   BUN 9 06/10/2015 0632   CREATININE 0.61 06/10/2015 0632   CALCIUM 7.8* 06/10/2015 0632   PROT 5.5* 06/08/2015 1346   ALBUMIN 2.4* 06/08/2015 1346   AST 19 06/08/2015 1346   ALT 20 06/08/2015 1346   ALKPHOS 103 06/08/2015 1346   BILITOT 0.7 06/08/2015 1346   GFRNONAA >60 06/10/2015 0632   GFRAA >60 06/10/2015 3382   PT/INR No results for input(s): LABPROT, INR in the last 72 hours.  Studies/Results: No results found.  Assessment/Plan: 59 year old female that his medicine service with GI bleed. No current acute surgical needs Should she develop GI bleeding again would continue to recommend transfer to [facility] for evaluation and treatment by her surgeons there. Please call if we can be of further assistance.  Clayburn Pert, MD FACS General Surgeon [facility]

## 2015-06-12 NOTE — Discharge Instructions (Signed)
Follow with Dr. Candace Cruise in 1 week. Follow with [facility] surgical clinic in 1-2 weeks. Follow with vascular clinic in 1 month.

## 2015-06-12 NOTE — Progress Notes (Signed)
Alert and oriented. Vss. No signs of acute distress. tolerating diet. Denies pain and discomfort. Discharge instructions given. Patient verbalized understanding.

## 2015-06-12 NOTE — Discharge Summary (Signed)
[facility] [place] NAME: Mia Gray    MR#:  [id number]  DATE OF BIRTH:  Apr 06, 1956  DATE OF ADMISSION:  06/08/2015 ADMITTING PHYSICIAN: Henreitta Leber, MD  DATE OF DISCHARGE:  06/12/2015  PRIMARY CARE PHYSICIAN: Letta Median, MD    ADMISSION DIAGNOSIS:  Upper GI bleeding [K92.2]  DISCHARGE DIAGNOSIS:  Active Problems:   GI bleed   SECONDARY DIAGNOSIS:   Past Medical History  Diagnosis Date  . Migraine   . Bronchitis   . Hiatal hernia   . Hypertension     HOSPITAL COURSE:   59 year old female with past medical history significant for hypertension, migraines, hiatal hernia status post repair about 2 weeks ago presents to the hospital secondary to bleed and hypotension  #1 acute on chronic blood loss anemia-secondary to upper GI bleed. -Patient has had melena and also active bleeding through her gastrostomy tube -Likely secondary to Xarelto. stopped. -Received 2 units of packed RBC transfusion - Hb stable now. -Appreciate GI consult. No indication for any EGD especially in light of recent surgery -Conservative management. Support with transfusion as needed. Hold Xarelto and no other blood thinners. -Frequent monitoring of hemoglobin. -Started on clear liquids tolerating, slow upgrade per GI. Tolerated soft diet. -Also on a Protonix drip. changed over to IV twice a day .  #2. Hypotension-likely from hypovolemia. -IV fluids as needed, improved blood pressure. Not on any vasopressors. -Continue to hold her metoprolol - BP stable now.  #3 right arm DVT-triggered by PICC line at a different facility in recent past.  - repeat Doppler - still shows DVT- appreciated vascular consult. - No anticoagulation, and advised to follow in office in 1 month.  #4 history of migraines-continue amitriptyline  #5 DVT prophylaxis-Ted's and SCDs   DISCHARGE CONDITIONS:   stable  CONSULTS OBTAINED:  Treatment  Team:  Hulen Luster, MD Sela Hua, PA-C  DRUG ALLERGIES:  No Known Allergies  DISCHARGE MEDICATIONS:   Current Discharge Medication List    START taking these medications   Details  feeding supplement, ENSURE ENLIVE, (ENSURE ENLIVE) LIQD Take 237 mLs by mouth 3 (three) times daily. Qty: 237 mL, Refills: 12    pantoprazole (PROTONIX) 40 MG tablet Take 1 tablet (40 mg total) by mouth 2 (two) times daily. Qty: 60 tablet, Refills: 0      CONTINUE these medications which have CHANGED   Details  acetaminophen (TYLENOL) 325 MG tablet Take 2 tablets (650 mg total) by mouth 2 (two) times daily as needed for moderate pain or fever. Qty: 20 tablet, Refills: 0      CONTINUE these medications which have NOT CHANGED   Details  albuterol (PROVENTIL HFA;VENTOLIN HFA) 108 (90 BASE) MCG/ACT inhaler Inhale 1 puff into the lungs every 6 (six) hours as needed for wheezing or shortness of breath.    amitriptyline (ELAVIL) 25 MG tablet Take 50 mg by mouth at bedtime.    cetirizine (ZYRTEC) 10 MG tablet Take 10 mg by mouth daily as needed for allergies.    docusate sodium (COLACE) 100 MG capsule Take 100 mg by mouth 2 (two) times daily as needed for mild constipation.    ipratropium (ATROVENT HFA) 17 MCG/ACT inhaler Inhale 2 puffs into the lungs every 6 (six) hours as needed for wheezing.    metoprolol tartrate (LOPRESSOR) 25 MG tablet Take 12.5 mg by mouth 2 (two) times daily.    ondansetron (ZOFRAN-ODT) 4 MG disintegrating tablet Take 4  mg by mouth every 8 (eight) hours as needed for nausea or vomiting.    sodium chloride (OCEAN) 0.65 % SOLN nasal spray Place 1 spray into both nostrils as needed for congestion.    SUMAtriptan (IMITREX) 50 MG tablet Take 50 mg by mouth every 2 (two) hours as needed for migraine. May repeat in 2 hours if headache persists or recurs.      STOP taking these medications     Rivaroxaban (XARELTO) 15 MG TABS tablet          DISCHARGE INSTRUCTIONS:     Follow with GI, Vascular and [facility] surgery clinic.  If you experience worsening of your admission symptoms, develop shortness of breath, life threatening emergency, suicidal or homicidal thoughts you must seek medical attention immediately by calling 911 or calling your MD immediately  if symptoms less severe.  You Must read complete instructions/literature along with all the possible adverse reactions/side effects for all the Medicines you take and that have been prescribed to you. Take any new Medicines after you have completely understood and accept all the possible adverse reactions/side effects.   Please note  You were cared for by a hospitalist during your hospital stay. If you have any questions about your discharge medications or the care you received while you were in the hospital after you are discharged, you can call the unit and asked to speak with the hospitalist on call if the hospitalist that took care of you is not available. Once you are discharged, your primary care physician will handle any further medical issues. Please note that NO REFILLS for any discharge medications will be authorized once you are discharged, as it is imperative that you return to your primary care physician (or establish a relationship with a primary care physician if you do not have one) for your aftercare needs so that they can reassess your need for medications and monitor your lab values.    Today   CHIEF COMPLAINT:   Chief Complaint  Patient presents with  . GI Bleeding    HISTORY OF PRESENT ILLNESS:  Mia Gray  is a 59 y.o. 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 Patient says that she's been feeling dizzy now for the past 2 days where she feels like when she gets up that  she is going to pass out. She has also noticed black dark stools now for the past week to 10 days and also noted some bloody drainage from her PEG tube. Since she was not feeling much better she came to the ER for further evaluation and she was noted to be hypotensive with systolic blood pressures in the 60s. Patient was given 2 L IV fluid bolus and her hemodynamics have improved but she was also noted to be anemic with a hemoglobin of 7.9 and a previous hemoglobin by 2 weeks ago was around 12. She likely has underlying upper GI bleed given her melanotic stools hypotension and acute blood loss anemia and hospitalist services were contacted further treatment and evaluation. Patient denies any hematemesis, abdominal pain, nausea, vomiting, fevers, chills, chest pain, shortness of breath or any other associated symptoms.   VITAL SIGNS:  Blood pressure 124/71, pulse 109, temperature 98.6 F (37 C), temperature source Oral, resp. rate 19, height 5\' 2"  (1.575 m), weight 80 kg (176 lb  5.9 oz), SpO2 97 %.  I/O:   Intake/Output Summary (Last 24 hours) at 06/12/15 1428 Last data filed at 06/12/15 1310  Gross per 24 hour  Intake 2613.3 ml  Output      0 ml  Net 2613.3 ml    PHYSICAL EXAMINATION:   GENERAL: 59 y.o.-year-old patient lying in the bed with no acute distress.  EYES: Pupils equal, round, reactive to light and accommodation. No scleral icterus. Extraocular muscles intact.  HEENT: Head atraumatic, normocephalic. Oropharynx and nasopharynx clear.  NECK: Supple, no jugular venous distention. No thyroid enlargement, no tenderness.  LUNGS: Normal breath sounds bilaterally, no wheezing, rales,rhonchi or crepitation. No use of accessory muscles of respiration.  CARDIOVASCULAR: S1, S2 normal. No murmurs, rubs, or gallops.  ABDOMEN: Soft, nontender, nondistended. Bowel sounds present. No organomegaly or mass.  Gastrostomy tub in place and some bleeding noted EXTREMITIES: No pedal edema,  cyanosis, or clubbing.  NEUROLOGIC: Cranial nerves II through XII are intact. Muscle strength 5/5 in all extremities. Sensation intact. Gait not checked.  PSYCHIATRIC: The patient is alert and oriented x 3.  SKIN: No obvious rash, lesion, or ulcer.   DATA REVIEW:   CBC  Recent Labs Lab 06/12/15 0932  WBC 4.9  HGB 10.4*  HCT 31.7*  PLT 361    Chemistries   Recent Labs Lab 06/08/15 1346  06/10/15 0632  NA 140  < > 141  K 5.0  < > 3.7  CL 109  < > 113*  CO2 27  < > 21*  GLUCOSE 122*  < > 109*  BUN 16  < > 9  CREATININE 0.66  < > 0.61  CALCIUM 7.5*  < > 7.8*  AST 19  --   --   ALT 20  --   --   ALKPHOS 103  --   --   BILITOT 0.7  --   --   < > = values in this interval not displayed.  Cardiac Enzymes  Recent Labs Lab 06/08/15 1346  TROPONINI <0.03    Microbiology Results  Results for orders placed or performed during the hospital encounter of 06/08/15  MRSA PCR Screening     Status: None   Collection Time: 06/08/15  6:45 PM  Result Value Ref Range Status   MRSA by PCR NEGATIVE NEGATIVE Final    Comment:        The GeneXpert MRSA Assay (FDA approved for NASAL specimens only), is one component of a comprehensive MRSA colonization surveillance program. It is not intended to diagnose MRSA infection nor to guide or monitor treatment for MRSA infections.     RADIOLOGY:  No results found.   Management plans discussed with the patient, family and they are in agreement.  CODE STATUS:     Code Status Orders        Start     Ordered   06/08/15 1810  Full code   Continuous     06/08/15 1809      TOTAL TIME TAKING CARE OF THIS PATIENT: 35 minutes.    Vaughan Basta M.D on 06/12/2015 at 2:28 PM  Between 7am to 6pm - Pager - [phone]  After 6pm go to [email or web address] - password [email or web address] [facility]  Office  [phone]  CC: Primary care physician; Letta Median, MD

## 2016-01-04 ENCOUNTER — Other Ambulatory Visit: Payer: Self-pay | Admitting: Family Medicine

## 2016-01-04 DIAGNOSIS — Z1231 Encounter for screening mammogram for malignant neoplasm of breast: Secondary | ICD-10-CM

## 2016-01-10 ENCOUNTER — Other Ambulatory Visit: Payer: Self-pay | Admitting: Thoracic Surgery

## 2016-01-10 ENCOUNTER — Ambulatory Visit
Admission: RE | Admit: 2016-01-10 | Discharge: 2016-01-10 | Disposition: A | Discharge status: Home / Self Care | Payer: Disability Insurance | Source: Ambulatory Visit | Attending: Thoracic Surgery | Admitting: Thoracic Surgery

## 2016-01-10 DIAGNOSIS — M1990 Unspecified osteoarthritis, unspecified site: Secondary | ICD-10-CM

## 2016-01-10 DIAGNOSIS — M25552 Pain in left hip: Secondary | ICD-10-CM | POA: Diagnosis not present

## 2016-02-09 ENCOUNTER — Ambulatory Visit
Admission: RE | Admit: 2016-02-09 | Discharge: 2016-02-09 | Disposition: A | Discharge status: Home / Self Care | Payer: BLUE CROSS/BLUE SHIELD | Source: Ambulatory Visit | Attending: Family Medicine | Admitting: Family Medicine

## 2016-02-09 DIAGNOSIS — Z1231 Encounter for screening mammogram for malignant neoplasm of breast: Secondary | ICD-10-CM | POA: Insufficient documentation

## 2016-02-15 ENCOUNTER — Other Ambulatory Visit: Payer: Self-pay | Admitting: Family Medicine

## 2016-02-15 DIAGNOSIS — R928 Other abnormal and inconclusive findings on diagnostic imaging of breast: Secondary | ICD-10-CM

## 2016-02-21 ENCOUNTER — Ambulatory Visit
Admission: RE | Admit: 2016-02-21 | Discharge: 2016-02-21 | Disposition: A | Discharge status: Home / Self Care | Payer: BLUE CROSS/BLUE SHIELD | Source: Ambulatory Visit | Attending: Family Medicine | Admitting: Family Medicine

## 2016-02-21 DIAGNOSIS — R928 Other abnormal and inconclusive findings on diagnostic imaging of breast: Secondary | ICD-10-CM

## 2016-02-21 DIAGNOSIS — R921 Mammographic calcification found on diagnostic imaging of breast: Secondary | ICD-10-CM | POA: Diagnosis present

## 2016-03-31 IMAGING — US US EXTREM  UP VENOUS*R*
1 series · 13 of 24 positions shown · non-contrast
Comparison: None.

ADDENDUM:
These results were called by telephone at the time of interpretation
on 06/09/2015 at [DATE] to Dr. Adolfo , who verbally acknowledged
these results.
CLINICAL DATA: Right upper extremity swelling. Previous history of
right upper extremity DVT. PICC line placed 6 days ago.



[Series 1: us extrem up venous*right* · 0.07mm/px · 13 of 47 slices shown]
[im 1/47]
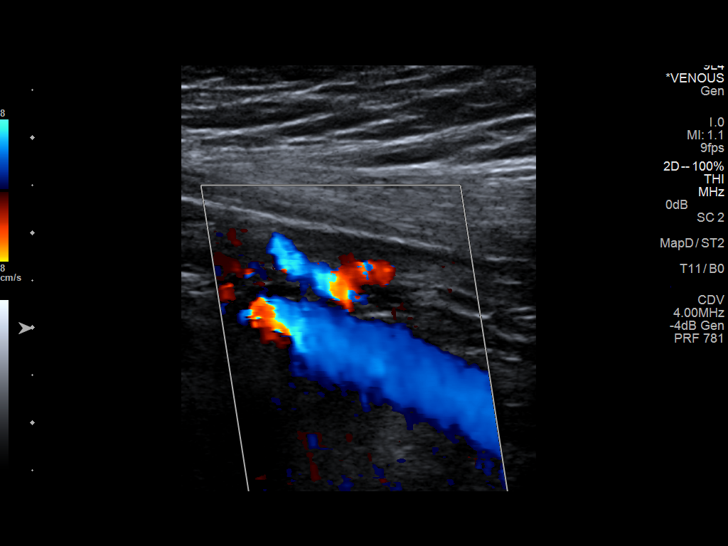
[im 5/47]
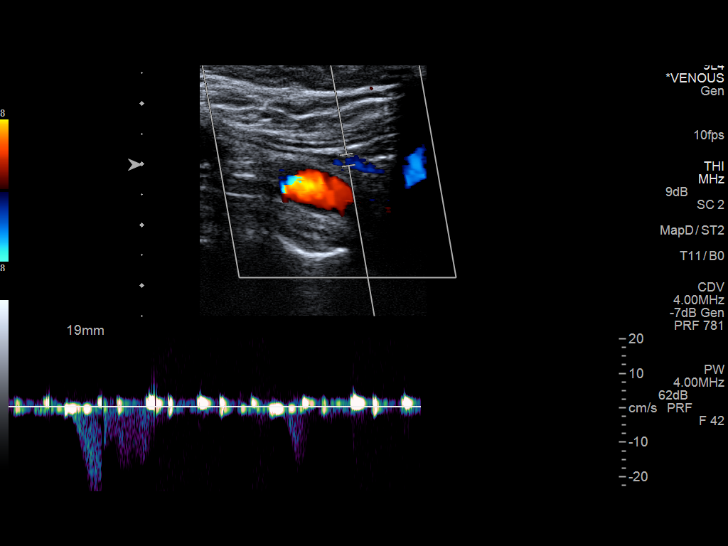
[im 9/47]
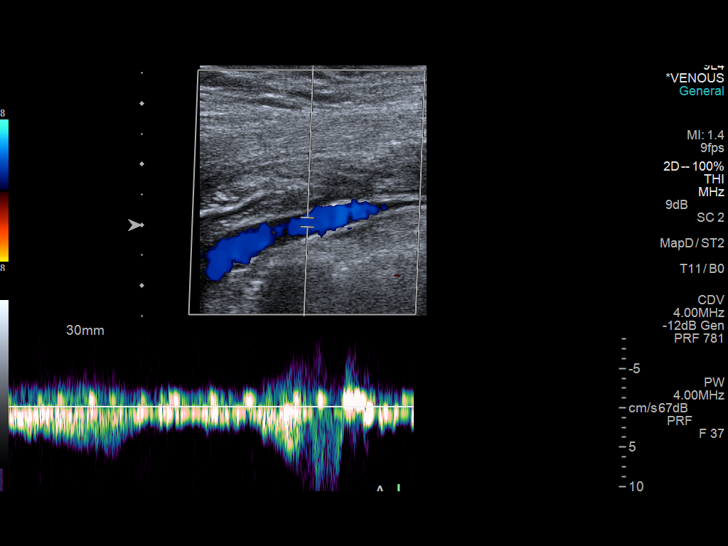
[im 13/47]
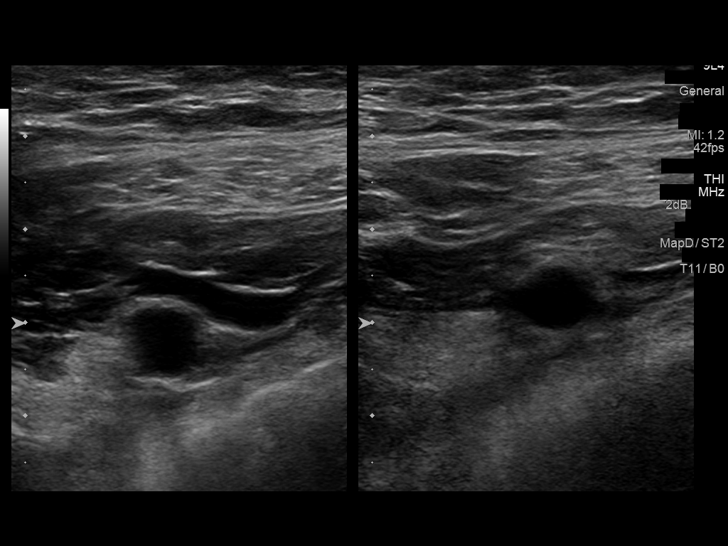
[im 17/47]
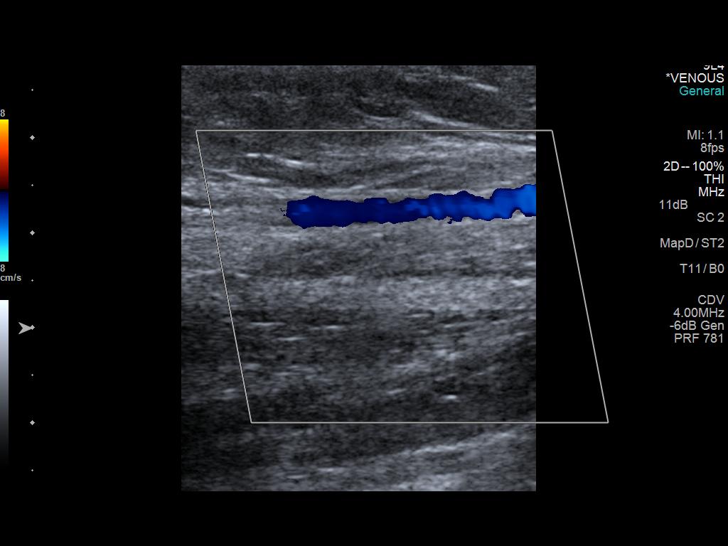
[im 21/47]
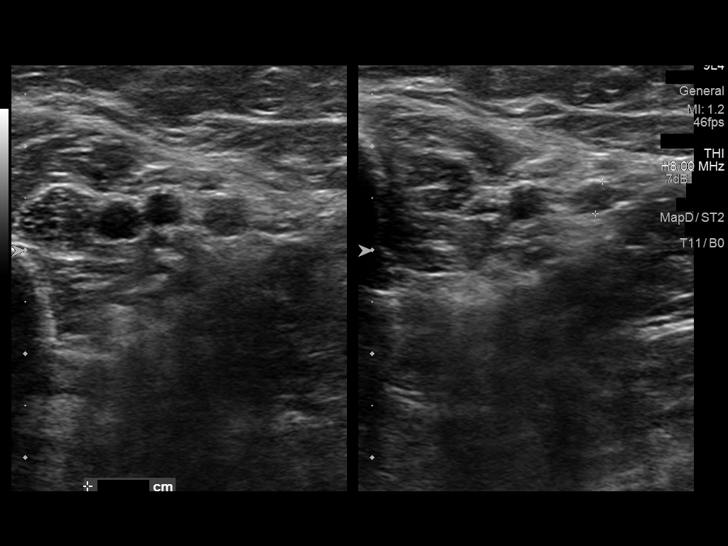
[im 25/47]
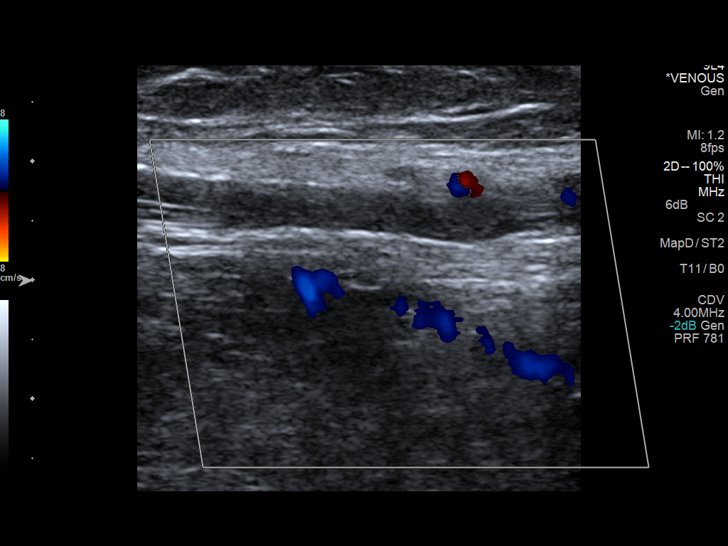
[im 27/47]
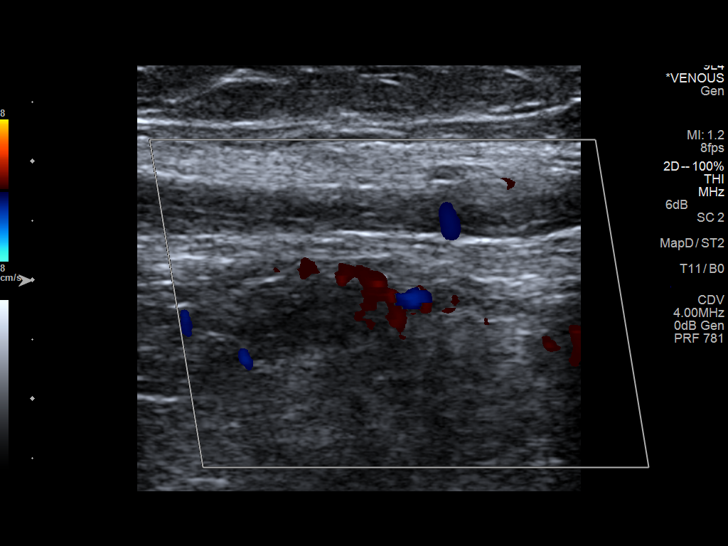
[im 31/47]
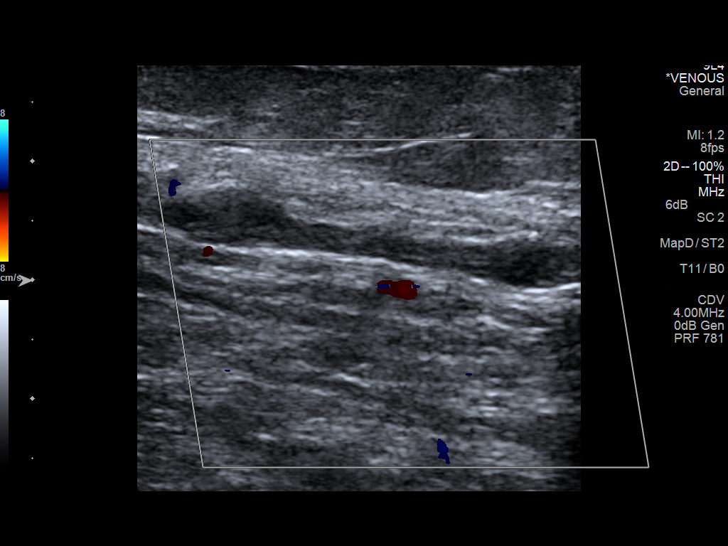
[im 35/47]
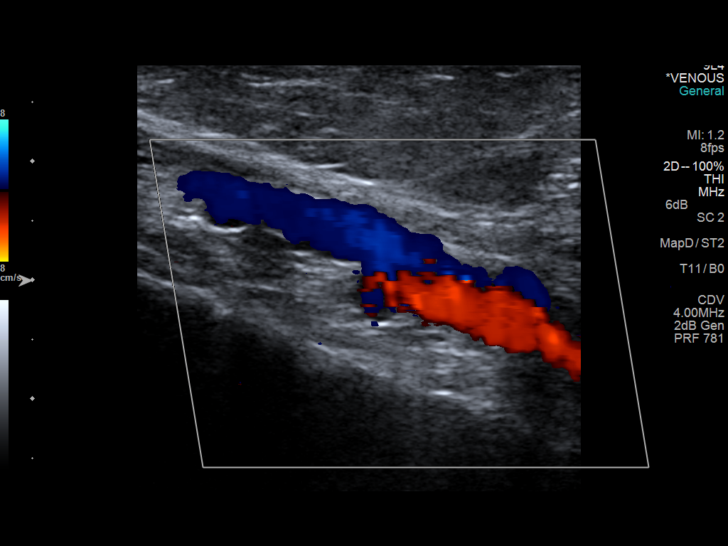
[im 39/47]
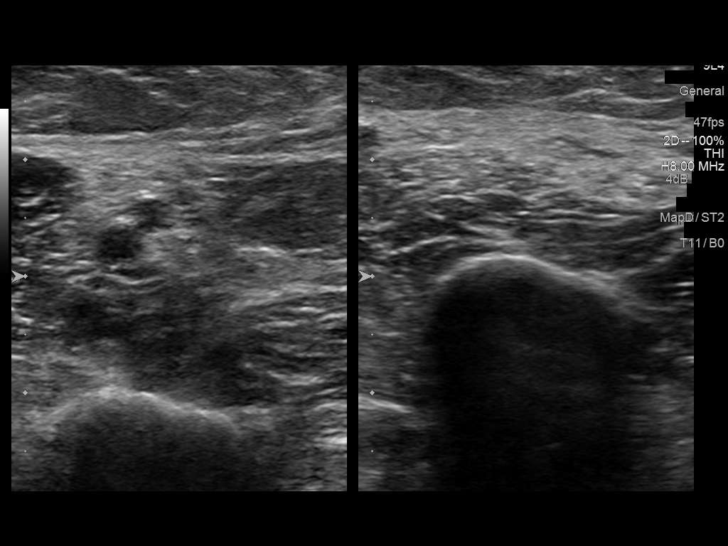
[im 43/47]
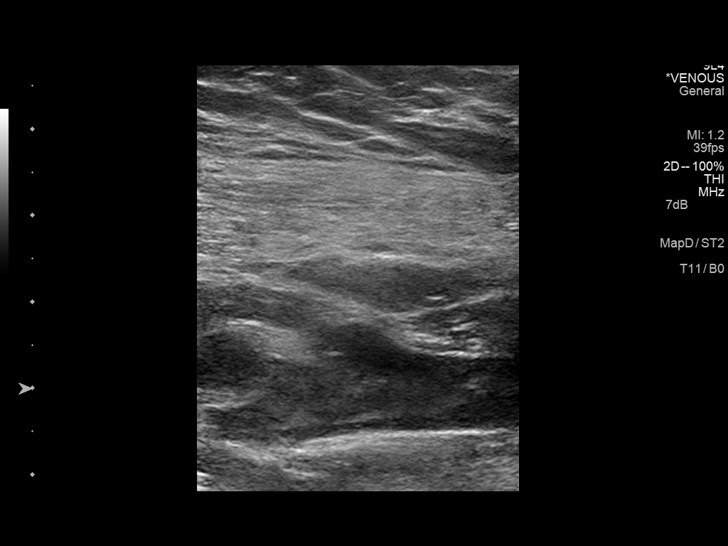
[im 47/47]
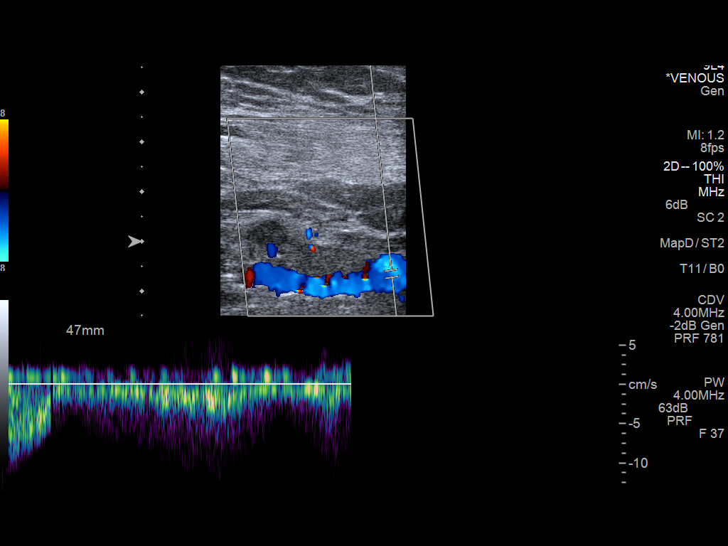

[13 of 24 positions shown; findings below may reference images not displayed]

FINDINGS: Contralateral Subclavian Vein: Respiratory phasicity is normal and
symmetric with the symptomatic side. No evidence of thrombus. Normal
compressibility.

Internal Jugular Vein: No evidence of thrombus. Normal
compressibility, respiratory phasicity and response to augmentation.

Subclavian Vein: No evidence of thrombus. Normal compressibility,
respiratory phasicity and response to augmentation.

Axillary Vein: Evidence of echogenic intraluminal occlusive
noncompressible thrombus with mild vessel expansion likely acute
thrombus.

Cephalic Vein: No evidence of thrombus. Normal compressibility,
respiratory phasicity and response to augmentation.

Basilic Vein: No evidence of thrombus. Normal compressibility,
respiratory phasicity and response to augmentation.

Brachial Veins: Evidence of intraluminal noncompressible occlusive
thrombus within 1 of 2 brachial veins visualized as this may be
acute or chronic. The other brachial vein is patent.

Radial Veins: No evidence of thrombus. Normal compressibility,
respiratory phasicity and response to augmentation.

Ulnar Veins: No evidence of thrombus. Normal compressibility,
respiratory phasicity and response to augmentation.

Venous Reflux:  None visualized.

Other Findings:  None visualized.
IMPRESSION: Evidence of acute occlusive thrombus within the right axillary vein.
Two brachial veins over the upper arm, 1 of which is patent and the
other of which contains occlusive thrombus which may be acute or
chronic.

## 2016-07-12 ENCOUNTER — Other Ambulatory Visit: Payer: Self-pay | Admitting: Family Medicine

## 2016-07-12 DIAGNOSIS — Z1231 Encounter for screening mammogram for malignant neoplasm of breast: Secondary | ICD-10-CM

## 2016-07-14 ENCOUNTER — Emergency Department
Admission: EM | Admit: 2016-07-14 | Discharge: 2016-07-14 | Disposition: A | Discharge status: Home / Self Care | Payer: BLUE CROSS/BLUE SHIELD | Attending: Emergency Medicine | Admitting: Emergency Medicine

## 2016-07-14 ENCOUNTER — Encounter: Payer: Self-pay | Admitting: Emergency Medicine

## 2016-07-14 DIAGNOSIS — K047 Periapical abscess without sinus: Secondary | ICD-10-CM | POA: Diagnosis not present

## 2016-07-14 DIAGNOSIS — I10 Essential (primary) hypertension: Secondary | ICD-10-CM | POA: Diagnosis not present

## 2016-07-14 DIAGNOSIS — Z79899 Other long term (current) drug therapy: Secondary | ICD-10-CM | POA: Insufficient documentation

## 2016-07-14 DIAGNOSIS — K0889 Other specified disorders of teeth and supporting structures: Secondary | ICD-10-CM | POA: Diagnosis present

## 2016-07-14 MED ORDER — PENICILLIN V POTASSIUM 500 MG PO TABS: 500.0000 mg | ORAL_TABLET | Freq: Four times a day (QID) | ORAL | 0 refills | Status: DC

## 2016-07-14 NOTE — ED Notes (Signed)
States dental pain - mouth pain on the right. States doesn't have the money for a dentist.

## 2016-07-14 NOTE — ED Triage Notes (Signed)
Pt c/o pain to right lower tooth. Reports feels like face swelling. Difficult for RN to note swelling. Reports similar episode 2 months ago but never saw dentist because no money. Denies fevers.

## 2016-07-14 NOTE — Discharge Instructions (Signed)
Continue taking acetaminophen as needed for pain. Began taking penicillin 4 times a day for 10 days. Make an appointment with dentist of your choice. Avoid cold or hot food until tooth pain is gone.

## 2016-07-14 NOTE — ED Provider Notes (Signed)
[facility] Emergency Department Provider Note   ____________________________________________   First MD Initiated Contact with Patient 07/14/16 1614     (approximate)  I have reviewed the triage vital signs and the nursing notes.   HISTORY  Chief Complaint Dental Pain    HPI Mia Gray is a 60 y.o. female is here complaining of dental pain. Patient states that this began hurting this week. She states there is been some swelling to the lower part of her thumb. Patient states that she has had similar episode approximately 2 months ago but did not see a dentist because of financial reasons. Patient has been taking acetaminophen for her pain occasionally. Patient was under the impression that she could not take this medication because of her "bleeding ulcer". Patient was reassured that this medication would be okay for her to take instead of the other over-the-counter "pain relievers". Currently she rates her pain as a 9/10.   Past Medical History:  Diagnosis Date  . Bronchitis   . Hiatal hernia   . Hypertension   . Migraine     Patient Active Problem List   Diagnosis Date Noted  . GI bleed 06/08/2015  . Upper GI bleeding   . Hiatal hernia 12/07/2013    Past Surgical History:  Procedure Laterality Date  . BREAST CYST ASPIRATION Right 10+ yrs ago   negative  . BREAST SURGERY Right    lumpectomy  . CHOLECYSTECTOMY     [facility]  . haital hernia    . HERNIA REPAIR Right    x 2  . HERNIA REPAIR Left    [place]    Prior to Admission medications   Medication Sig Start Date End Date Taking? Authorizing Provider  acetaminophen (TYLENOL) 325 MG tablet Take 2 tablets (650 mg total) by mouth 2 (two) times daily as needed for moderate pain or fever. 06/12/15   Vaughan Basta, MD  albuterol (PROVENTIL HFA;VENTOLIN HFA) 108 (90 BASE) MCG/ACT inhaler Inhale 1 puff into the lungs every 6 (six) hours as needed for wheezing or shortness of breath.     Historical Provider, MD  amitriptyline (ELAVIL) 25 MG tablet Take 50 mg by mouth at bedtime.    Historical Provider, MD  cetirizine (ZYRTEC) 10 MG tablet Take 10 mg by mouth daily as needed for allergies.    Historical Provider, MD  docusate sodium (COLACE) 100 MG capsule Take 100 mg by mouth 2 (two) times daily as needed for mild constipation.    Historical Provider, MD  feeding supplement, ENSURE ENLIVE, (ENSURE ENLIVE) LIQD Take 237 mLs by mouth 3 (three) times daily. 06/12/15   Vaughan Basta, MD  ipratropium (ATROVENT HFA) 17 MCG/ACT inhaler Inhale 2 puffs into the lungs every 6 (six) hours as needed for wheezing.    Historical Provider, MD  metoprolol tartrate (LOPRESSOR) 25 MG tablet Take 12.5 mg by mouth 2 (two) times daily.    Historical Provider, MD  ondansetron (ZOFRAN-ODT) 4 MG disintegrating tablet Take 4 mg by mouth every 8 (eight) hours as needed for nausea or vomiting.    Historical Provider, MD  pantoprazole (PROTONIX) 40 MG tablet Take 1 tablet (40 mg total) by mouth 2 (two) times daily. 06/12/15   Vaughan Basta, MD  penicillin v potassium (VEETID) 500 MG tablet Take 1 tablet (500 mg total) by mouth 4 (four) times daily. 07/14/16   Johnn Hai, PA-C  sodium chloride (OCEAN) 0.65 % SOLN nasal spray Place 1 spray into both nostrils as  needed for congestion.    Historical Provider, MD  SUMAtriptan (IMITREX) 50 MG tablet Take 50 mg by mouth every 2 (two) hours as needed for migraine. May repeat in 2 hours if headache persists or recurs.    Historical Provider, MD    Allergies Review of patient's allergies indicates no known allergies.  Family History  Problem Relation Age of Onset  . Cancer Mother     breast and colon  . Stroke Mother   . Breast cancer Mother     Social History Social History  Substance Use Topics  . Smoking status: Never Smoker  . Smokeless tobacco: Never Used  . Alcohol use No    Review of Systems Constitutional: No  fever/chills Eyes: No visual changes. ENT: No sore throat. Positive dental pain. Cardiovascular: Denies chest pain. Respiratory: Denies shortness of breath. Gastrointestinal:   No nausea, no vomiting.   Skin: Negative for rash. Neurological: Negative for headaches, focal weakness or numbness.  10-point ROS otherwise negative.  ____________________________________________   PHYSICAL EXAM:  VITAL SIGNS: ED Triage Vitals  Enc Vitals Group     BP 07/14/16 1552 135/90     Pulse Rate 07/14/16 1552 84     Resp 07/14/16 1552 18     Temp 07/14/16 1552 98.3 F (36.8 C)     Temp Source 07/14/16 1552 Oral     SpO2 07/14/16 1552 97 %     Weight 07/14/16 1551 160 lb (72.6 kg)     Height 07/14/16 1551 5\' 2"  (1.575 m)     Head Circumference --      Peak Flow --      Pain Score 07/14/16 1550 9     Pain Loc --      Pain Edu? --      Excl. in [place]? --     Constitutional: Alert and oriented. Well appearing and in no acute distress. Eyes: Conjunctivae are normal. PERRL. EOMI. Head: Atraumatic. Nose: No congestion/rhinnorhea. Mouth/Throat: Mucous membranes are moist.  Oropharynx non-erythematous.Right lower anterior gum is slightly swollen. Patient has had several teeth extracted from the area. Positive for caries. Gum is extremely tender to palpation with a tongue depressor. No obvious abscess is seen. Neck: No stridor.   Hematological/Lymphatic/Immunilogical: No cervical lymphadenopathy. Cardiovascular: Normal rate, regular rhythm. Grossly normal heart sounds.  Good peripheral circulation. Respiratory: Normal respiratory effort.  No retractions. Lungs CTAB. Musculoskeletal: Moves upper and lower extremities without difficulty and normal gait was noted. Neurologic:  Normal speech and language. No gross focal neurologic deficits are appreciated. No gait instability. Skin:  Skin is warm, dry and intact. No rash noted. Psychiatric: Mood and affect are normal. Speech and behavior are  normal.  ____________________________________________   LABS (all labs ordered are listed, but only abnormal results are displayed)  Labs Reviewed - No data to display ____________________________________________   PROCEDURES  Procedure(s) performed: None  Procedures  Critical Care performed: No  ____________________________________________   INITIAL IMPRESSION / ASSESSMENT AND PLAN / ED COURSE  Pertinent labs & imaging results that were available during my care of the patient were reviewed by me and considered in my medical decision making (see chart for details).    Clinical Course   Patient was encouraged to find a dentist as she does have insurance. She is given a prescription for Pen-Vee K 500 mg 4 times a day for 10 days. Patient is continue taking acetaminophen for pain as needed. She'll will follow up with her primary care doctor if any  continued problems.  ____________________________________________   FINAL CLINICAL IMPRESSION(S) / ED DIAGNOSES  Final diagnoses:  Dental abscess      NEW MEDICATIONS STARTED DURING THIS VISIT:  New Prescriptions   PENICILLIN V POTASSIUM (VEETID) 500 MG TABLET    Take 1 tablet (500 mg total) by mouth 4 (four) times daily.     Note:  This document was prepared using Dragon voice recognition software and may include unintentional dictation errors.    Johnn Hai, PA-C 07/14/16 1632    Lisa Roca, MD 07/14/16 [phone]

## 2016-08-08 ENCOUNTER — Other Ambulatory Visit: Payer: Self-pay | Admitting: Family Medicine

## 2016-08-08 DIAGNOSIS — R591 Generalized enlarged lymph nodes: Secondary | ICD-10-CM

## 2016-08-12 ENCOUNTER — Ambulatory Visit: Payer: BLUE CROSS/BLUE SHIELD

## 2016-08-23 ENCOUNTER — Other Ambulatory Visit: Payer: No Typology Code available for payment source

## 2016-08-23 ENCOUNTER — Ambulatory Visit: Payer: No Typology Code available for payment source

## 2016-09-09 ENCOUNTER — Ambulatory Visit
Admission: RE | Admit: 2016-09-09 | Discharge: 2016-09-09 | Disposition: A | Discharge status: Home / Self Care | Payer: BLUE CROSS/BLUE SHIELD | Source: Ambulatory Visit | Attending: Family Medicine | Admitting: Family Medicine

## 2016-09-09 DIAGNOSIS — R591 Generalized enlarged lymph nodes: Secondary | ICD-10-CM | POA: Insufficient documentation

## 2016-10-01 ENCOUNTER — Ambulatory Visit
Admission: RE | Admit: 2016-10-01 | Discharge: 2016-10-01 | Disposition: A | Discharge status: Home / Self Care | Payer: BLUE CROSS/BLUE SHIELD | Source: Ambulatory Visit | Attending: Family Medicine | Admitting: Family Medicine

## 2016-10-01 DIAGNOSIS — R928 Other abnormal and inconclusive findings on diagnostic imaging of breast: Secondary | ICD-10-CM | POA: Diagnosis not present

## 2016-10-01 DIAGNOSIS — Z1231 Encounter for screening mammogram for malignant neoplasm of breast: Secondary | ICD-10-CM

## 2016-10-01 DIAGNOSIS — R591 Generalized enlarged lymph nodes: Secondary | ICD-10-CM | POA: Insufficient documentation

## 2016-10-03 ENCOUNTER — Other Ambulatory Visit: Payer: Self-pay | Admitting: Family Medicine

## 2016-10-03 DIAGNOSIS — R928 Other abnormal and inconclusive findings on diagnostic imaging of breast: Secondary | ICD-10-CM

## 2016-10-03 DIAGNOSIS — R921 Mammographic calcification found on diagnostic imaging of breast: Secondary | ICD-10-CM

## 2016-10-15 ENCOUNTER — Ambulatory Visit
Admission: RE | Admit: 2016-10-15 | Discharge: 2016-10-15 | Disposition: A | Discharge status: Home / Self Care | Payer: BLUE CROSS/BLUE SHIELD | Source: Ambulatory Visit | Attending: Family Medicine | Admitting: Family Medicine

## 2016-10-15 DIAGNOSIS — R928 Other abnormal and inconclusive findings on diagnostic imaging of breast: Secondary | ICD-10-CM | POA: Diagnosis not present

## 2016-10-15 DIAGNOSIS — R921 Mammographic calcification found on diagnostic imaging of breast: Secondary | ICD-10-CM

## 2016-10-15 HISTORY — PX: BREAST BIOPSY: SHX20

## 2016-10-16 LAB — SURGICAL PATHOLOGY

## 2017-05-09 ENCOUNTER — Telehealth: Payer: Self-pay | Admitting: Gastroenterology

## 2017-05-09 NOTE — Telephone Encounter (Signed)
Patient called and medicine isn't working (Polyethylene 255 gm) please call patient.

## 2017-05-09 NOTE — Telephone Encounter (Signed)
Patient has been asked to contact [place] [facility] for her constipation.  She stated she is a pt of Dr. Ellin Mayhew. P"hone number given to pt to contact.

## 2017-07-12 ENCOUNTER — Emergency Department
Admission: EM | Admit: 2017-07-12 | Discharge: 2017-07-12 | Disposition: A | Discharge status: Home / Self Care | Payer: BLUE CROSS/BLUE SHIELD | Attending: Emergency Medicine | Admitting: Emergency Medicine

## 2017-07-12 ENCOUNTER — Emergency Department: Payer: BLUE CROSS/BLUE SHIELD

## 2017-07-12 DIAGNOSIS — Z79899 Other long term (current) drug therapy: Secondary | ICD-10-CM | POA: Insufficient documentation

## 2017-07-12 DIAGNOSIS — B9789 Other viral agents as the cause of diseases classified elsewhere: Secondary | ICD-10-CM

## 2017-07-12 DIAGNOSIS — I10 Essential (primary) hypertension: Secondary | ICD-10-CM | POA: Diagnosis not present

## 2017-07-12 DIAGNOSIS — Z9049 Acquired absence of other specified parts of digestive tract: Secondary | ICD-10-CM | POA: Diagnosis not present

## 2017-07-12 DIAGNOSIS — J069 Acute upper respiratory infection, unspecified: Secondary | ICD-10-CM

## 2017-07-12 DIAGNOSIS — R05 Cough: Secondary | ICD-10-CM | POA: Diagnosis present

## 2017-07-12 MED ORDER — PSEUDOEPH-BROMPHEN-DM 30-2-10 MG/5ML PO SYRP: 5.0000 mL | ORAL_SOLUTION | Freq: Four times a day (QID) | ORAL | 0 refills | Status: DC | PRN

## 2017-07-12 NOTE — ED Triage Notes (Signed)
Pt came to ED via pov c/o cough for 1 week, reports has been taking OTC medicine and reports still cough. Gets frequent bronchitis.  Vs stable at this time

## 2017-07-12 NOTE — ED Notes (Signed)
Pt discharged to home.  Discharge instructions reviewed.  Verbalized understanding.  No questions or concerns at this time.  Teach back verified.  Pt in NAD.  No items left in ED.   

## 2017-07-12 NOTE — ED Provider Notes (Signed)
[facility] Emergency Department Provider Note  ____________________________________________   First MD Initiated Contact with Patient 07/12/17 1241     (approximate)  I have reviewed the triage vital signs and the nursing notes.   HISTORY  Chief Complaint Cough    HPI Mia Gray is a 61 y.o. female this is a complaint of cough for 1 week. Patient is been taking Mucinex over-the-counter with minimal relief. She denies any fever or chills. She denies any nausea or vomiting. Patient has never smoked but states that she frequently gets bronchitis.    Past Medical History:  Diagnosis Date  . Bronchitis   . Hiatal hernia   . Hypertension   . Migraine     Patient Active Problem List   Diagnosis Date Noted  . GI bleed 06/08/2015  . Upper GI bleeding   . Hiatal hernia 12/07/2013    Past Surgical History:  Procedure Laterality Date  . BREAST BIOPSY Right 10/15/2016   path pending  . BREAST CYST ASPIRATION Right 10+ yrs ago   negative  . BREAST SURGERY Right    lumpectomy  . CHOLECYSTECTOMY     [facility]  . haital hernia    . HERNIA REPAIR Right    x 2  . HERNIA REPAIR Left    [place]    Prior to Admission medications   Medication Sig Start Date End Date Taking? Authorizing Provider  acetaminophen (TYLENOL) 325 MG tablet Take 2 tablets (650 mg total) by mouth 2 (two) times daily as needed for moderate pain or fever. 06/12/15   Vaughan Basta, MD  albuterol (PROVENTIL HFA;VENTOLIN HFA) 108 (90 BASE) MCG/ACT inhaler Inhale 1 puff into the lungs every 6 (six) hours as needed for wheezing or shortness of breath.    [provider]  amitriptyline (ELAVIL) 25 MG tablet Take 50 mg by mouth at bedtime.    [provider]  brompheniramine-pseudoephedrine-DM 30-2-10 MG/5ML syrup Take 5 mLs by mouth 4 (four) times daily as needed. 07/12/17   Johnn Hai, PA-C  cetirizine (ZYRTEC) 10 MG tablet Take 10 mg by mouth daily  as needed for allergies.    [provider]  docusate sodium (COLACE) 100 MG capsule Take 100 mg by mouth 2 (two) times daily as needed for mild constipation.    [provider]  feeding supplement, ENSURE ENLIVE, (ENSURE ENLIVE) LIQD Take 237 mLs by mouth 3 (three) times daily. 06/12/15   Vaughan Basta, MD  ipratropium (ATROVENT HFA) 17 MCG/ACT inhaler Inhale 2 puffs into the lungs every 6 (six) hours as needed for wheezing.    [provider]  metoprolol tartrate (LOPRESSOR) 25 MG tablet Take 12.5 mg by mouth 2 (two) times daily.    [provider]  ondansetron (ZOFRAN-ODT) 4 MG disintegrating tablet Take 4 mg by mouth every 8 (eight) hours as needed for nausea or vomiting.    [provider]  pantoprazole (PROTONIX) 40 MG tablet Take 1 tablet (40 mg total) by mouth 2 (two) times daily. 06/12/15   Vaughan Basta, MD  penicillin v potassium (VEETID) 500 MG tablet Take 1 tablet (500 mg total) by mouth 4 (four) times daily. 07/14/16   Johnn Hai, PA-C  sodium chloride (OCEAN) 0.65 % SOLN nasal spray Place 1 spray into both nostrils as needed for congestion.    [provider]  SUMAtriptan (IMITREX) 50 MG tablet Take 50 mg by mouth every 2 (two) hours as needed for migraine. May repeat in  2 hours if headache persists or recurs.    [provider]    Allergies Patient has no known allergies.  Family History  Problem Relation Age of Onset  . Cancer Mother        breast and colon  . Stroke Mother   . Breast cancer Mother     Social History Social History  Substance Use Topics  . Smoking status: Never Smoker  . Smokeless tobacco: Never Used  . Alcohol use No    Review of Systems Constitutional: No fever/chills Cardiovascular: Denies chest pain. Respiratory: Denies shortness of breath. Positive for cough. Gastrointestinal:  No nausea, no vomiting.  Musculoskeletal: Negative for back pain. Skin: Negative  for rash. Neurological: Negative for  focal weakness or numbness. ____________________________________________   PHYSICAL EXAM:  VITAL SIGNS: ED Triage Vitals  Enc Vitals Group     BP 07/12/17 1144 (!) 152/94     Pulse Rate 07/12/17 1144 (!) 104     Resp 07/12/17 1144 18     Temp 07/12/17 1144 98.6 F (37 C)     Temp Source 07/12/17 1144 Oral     SpO2 07/12/17 1144 96 %     Weight 07/12/17 1145 170 lb (77.1 kg)     Height 07/12/17 1145 5\' 2"  (1.575 m)     Head Circumference --      Peak Flow --      Pain Score 07/12/17 1200 7     Pain Loc --      Pain Edu? --      Excl. in [place]? --    Constitutional: Alert and oriented. Well appearing and in no acute distress. Eyes: Conjunctivae are normal.  Head: Atraumatic. Nose: moderatecongestion/no rhinnorhea.   Mouth:  Mucous membranes moist. No erythema or exudate. Moderate posterior drainage present. Neck: No stridor.   Cardiovascular: Normal rate, regular rhythm. Grossly normal heart sounds.  Good peripheral circulation. Respiratory: Normal respiratory effort.  No retractions. Lungs CTAB.  Congestive cough positive. Musculoskeletal: No lower extremity tenderness nor edema.  No joint effusions. Neurologic:  Normal speech and language. No gross focal neurologic deficits are appreciated.  Skin:  Skin is warm, dry and intact.  Psychiatric: Mood and affect are normal. Speech and behavior are normal.  ____________________________________________   LABS (all labs ordered are listed, but only abnormal results are displayed)  Labs Reviewed - No data to display  RADIOLOGY  Dg Chest 2 View  Result Date: 07/12/2017 CLINICAL DATA:  Cough and shortness of Breath EXAM: CHEST  2 VIEW COMPARISON:  05/26/2015 FINDINGS: Large diaphragmatic hernia is identified with herniation of the stomach into the left chest similar to that seen on prior CT. Left basilar atelectasis is noted. No other focal abnormality is seen. Bony structures are within  normal limits. Cardiac shadow is stable. IMPRESSION: Large diaphragmatic hernia with left basilar atelectatic changes. Electronically Signed   By: Inez Catalina M.D.   On: 07/12/2017 12:27    ____________________________________________   PROCEDURES  Procedure(s) performed: None  Procedures  Critical Care performed: No  ____________________________________________   INITIAL IMPRESSION / ASSESSMENT AND PLAN / ED COURSE  Patient is given prescription for Percocet DM 1 teaspoon 4 times a day as needed for cough and congestion. She is to follow-up with her PCP if any continued problems. She is encouraged to drink fluids and also Tylenol if needed for fever or body aches.   ___________________________________________   FINAL CLINICAL IMPRESSION(S) / ED DIAGNOSES  Final diagnoses:  Viral URI  with cough      NEW MEDICATIONS STARTED DURING THIS VISIT:  Discharge Medication List as of 07/12/2017  1:16 PM    START taking these medications   Details  brompheniramine-pseudoephedrine-DM 30-2-10 MG/5ML syrup Take 5 mLs by mouth 4 (four) times daily as needed., Starting Sat 07/12/2017, Print         Note:  This document was prepared using Dragon voice recognition software and may include unintentional dictation errors.    Johnn Hai, PA-C 07/12/17 1551    Lisa Roca, MD 07/13/17 [phone]

## 2017-07-12 NOTE — Discharge Instructions (Addendum)
Follow-up with your primary care provider at [facility] if any continued problems next week. Increase fluids. Tylenol if needed for aches, headache or fever. Begin taking cough medication 1 teaspoon every 6 hours as needed for cough.

## 2017-07-12 NOTE — ED Notes (Signed)
Pt states she has had a cough for approx 1 week.  Pt denies going to PCP.  Pt states she's been taking mucinex for the past 2 days with no relief.  Pt states it feels like she needs to cough something up, but it's not helping.  Pt states she used her inhaler for a couple days, but it was not helping so she stopped.  Pt states she had a fever on Wednesday, but she took medication for it and no fever has returned.

## 2017-07-15 ENCOUNTER — Encounter: Payer: Self-pay | Admitting: Anesthesiology

## 2017-07-15 ENCOUNTER — Ambulatory Visit
Admission: RE | Admit: 2017-07-15 | Discharge: 2017-07-15 | Disposition: A | Discharge status: Home / Self Care | Payer: BLUE CROSS/BLUE SHIELD | Source: Ambulatory Visit | Attending: Gastroenterology | Admitting: Gastroenterology

## 2017-07-15 ENCOUNTER — Ambulatory Visit: Payer: BLUE CROSS/BLUE SHIELD | Admitting: Anesthesiology

## 2017-07-15 ENCOUNTER — Encounter
Admission: RE | Disposition: A | Discharge status: Home / Self Care | Payer: Self-pay | Source: Ambulatory Visit | Attending: Gastroenterology

## 2017-07-15 DIAGNOSIS — K5900 Constipation, unspecified: Secondary | ICD-10-CM | POA: Insufficient documentation

## 2017-07-15 DIAGNOSIS — K209 Esophagitis, unspecified: Secondary | ICD-10-CM | POA: Insufficient documentation

## 2017-07-15 DIAGNOSIS — K648 Other hemorrhoids: Secondary | ICD-10-CM | POA: Diagnosis not present

## 2017-07-15 DIAGNOSIS — K644 Residual hemorrhoidal skin tags: Secondary | ICD-10-CM | POA: Diagnosis not present

## 2017-07-15 DIAGNOSIS — Q438 Other specified congenital malformations of intestine: Secondary | ICD-10-CM | POA: Insufficient documentation

## 2017-07-15 DIAGNOSIS — K5730 Diverticulosis of large intestine without perforation or abscess without bleeding: Secondary | ICD-10-CM | POA: Diagnosis not present

## 2017-07-15 DIAGNOSIS — Z538 Procedure and treatment not carried out for other reasons: Secondary | ICD-10-CM | POA: Diagnosis not present

## 2017-07-15 DIAGNOSIS — I10 Essential (primary) hypertension: Secondary | ICD-10-CM | POA: Insufficient documentation

## 2017-07-15 DIAGNOSIS — L819 Disorder of pigmentation, unspecified: Secondary | ICD-10-CM | POA: Diagnosis not present

## 2017-07-15 DIAGNOSIS — K625 Hemorrhage of anus and rectum: Secondary | ICD-10-CM | POA: Insufficient documentation

## 2017-07-15 DIAGNOSIS — K2940 Chronic atrophic gastritis without bleeding: Secondary | ICD-10-CM | POA: Insufficient documentation

## 2017-07-15 DIAGNOSIS — Z9889 Other specified postprocedural states: Secondary | ICD-10-CM | POA: Diagnosis not present

## 2017-07-15 DIAGNOSIS — Z79899 Other long term (current) drug therapy: Secondary | ICD-10-CM | POA: Diagnosis not present

## 2017-07-15 DIAGNOSIS — D123 Benign neoplasm of transverse colon: Secondary | ICD-10-CM | POA: Diagnosis not present

## 2017-07-15 DIAGNOSIS — K635 Polyp of colon: Secondary | ICD-10-CM | POA: Diagnosis not present

## 2017-07-15 HISTORY — PX: ESOPHAGOGASTRODUODENOSCOPY (EGD) WITH PROPOFOL: SHX5813

## 2017-07-15 HISTORY — PX: COLONOSCOPY WITH PROPOFOL: SHX5780

## 2017-07-15 SURGERY — ESOPHAGOGASTRODUODENOSCOPY (EGD) WITH PROPOFOL
Anesthesia: General

## 2017-07-15 MED ORDER — PHENYLEPHRINE HCL 10 MG/ML IJ SOLN
INTRAMUSCULAR | Status: DC | PRN
  Administered 2017-07-15 (×3): 50 ug via INTRAVENOUS

## 2017-07-15 MED ORDER — PROPOFOL 500 MG/50ML IV EMUL
INTRAVENOUS | Status: DC | PRN
  Administered 2017-07-15: 120 ug/kg/min via INTRAVENOUS

## 2017-07-15 MED ORDER — LIDOCAINE HCL (CARDIAC) 20 MG/ML IV SOLN
INTRAVENOUS | Status: DC | PRN
  Administered 2017-07-15: 30 mg via INTRAVENOUS

## 2017-07-15 MED ORDER — SODIUM CHLORIDE 0.9 % IV SOLN: INTRAVENOUS | Status: DC

## 2017-07-15 MED ORDER — FENTANYL CITRATE (PF) 100 MCG/2ML IJ SOLN
INTRAMUSCULAR | Status: DC | PRN
  Administered 2017-07-15: 25 ug via INTRAVENOUS
  Administered 2017-07-15: 50 ug via INTRAVENOUS
  Administered 2017-07-15: 25 ug via INTRAVENOUS

## 2017-07-15 MED ORDER — PROPOFOL 500 MG/50ML IV EMUL
INTRAVENOUS | Status: AC
  Filled 2017-07-15: qty 50

## 2017-07-15 MED ORDER — IPRATROPIUM-ALBUTEROL 20-100 MCG/ACT IN AERS
INHALATION_SPRAY | RESPIRATORY_TRACT | Status: DC | PRN
  Administered 2017-07-15 (×2): 2 via RESPIRATORY_TRACT

## 2017-07-15 MED ORDER — PROPOFOL 10 MG/ML IV BOLUS
INTRAVENOUS | Status: AC
  Filled 2017-07-15: qty 20

## 2017-07-15 MED ORDER — FENTANYL CITRATE (PF) 100 MCG/2ML IJ SOLN
INTRAMUSCULAR | Status: AC
  Filled 2017-07-15: qty 2

## 2017-07-15 MED ORDER — MIDAZOLAM HCL 2 MG/2ML IJ SOLN
INTRAMUSCULAR | Status: AC
  Filled 2017-07-15: qty 2

## 2017-07-15 MED ORDER — BUTAMBEN-TETRACAINE-BENZOCAINE 2-2-14 % EX AERO
INHALATION_SPRAY | CUTANEOUS | Status: AC
  Filled 2017-07-15: qty 5

## 2017-07-15 MED ORDER — LIDOCAINE HCL (PF) 2 % IJ SOLN
INTRAMUSCULAR | Status: AC
  Filled 2017-07-15: qty 10

## 2017-07-15 MED ORDER — MIDAZOLAM HCL 2 MG/2ML IJ SOLN
INTRAMUSCULAR | Status: DC | PRN
  Administered 2017-07-15: 2 mg via INTRAVENOUS

## 2017-07-15 MED ORDER — SODIUM CHLORIDE 0.9 % IV SOLN
INTRAVENOUS | Status: DC
  Administered 2017-07-15: 1000 mL via INTRAVENOUS

## 2017-07-15 MED ORDER — ALBUTEROL SULFATE HFA 108 (90 BASE) MCG/ACT IN AERS
INHALATION_SPRAY | RESPIRATORY_TRACT | Status: AC
  Filled 2017-07-15: qty 6.7

## 2017-07-15 NOTE — Anesthesia Post-op Follow-up Note (Signed)
Anesthesia QCDR form completed.        

## 2017-07-15 NOTE — H&P (Signed)
Outpatient short stay form Pre-procedure 07/15/2017 7:44 AM Lollie Sails MD  Primary Physician: Dr. Rutherford Guys  Reason for visit:  EGD and colonoscopy  History of present illness:  Patient is a 61 year old female presenting today as above. 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 She does have some intermittent left upper quadrant discomfort. She does take a single dose of Prilosec daily. She does use powdered aspirin products occasionally. Further she has not had a colonoscopy since 2009. She had negative Hemoccult cards when seen in the office. When she saw the rectal  bleeding it was bright red.  She tolerated her prep well. She takes no Recent aspirin. She takes no blood thinning agent.   Current Facility-Administered Medications:  .  0.9 %  sodium chloride infusion, , Intravenous, Continuous, Lollie Sails, MD, Last Rate: 20 mL/hr at 07/15/17 0645, 1,000 mL at 07/15/17 0645 .  0.9 %  sodium chloride infusion, , Intravenous, Continuous, Lollie Sails, MD .  butamben-tetracaine-benzocaine (CETACAINE) 10-25-12 % spray, , , ,   Prescriptions Prior to Admission  Medication Sig Dispense Refill Last Dose  . acetaminophen (TYLENOL) 325 MG tablet Take 2 tablets (650 mg total) by mouth 2 (two) times daily as needed for moderate pain or fever. 20 tablet 0 Past Week at Unknown time  . albuterol (PROVENTIL HFA;VENTOLIN HFA) 108 (90 BASE) MCG/ACT inhaler Inhale 1 puff into the lungs every 6 (six) hours as needed for wheezing or shortness of breath.   Past Week at Unknown time  . amitriptyline (ELAVIL) 25 MG tablet Take 50 mg by mouth at bedtime.   Past Week at Unknown time  . brompheniramine-pseudoephedrine-DM 30-2-10 MG/5ML syrup Take 5 mLs by mouth 4 (four) times daily as needed. 120 mL 0 Past Week at Unknown time  . cetirizine (ZYRTEC) 10 MG tablet Take 10 mg by mouth daily as needed for allergies.    Past Week at Unknown time  . docusate sodium (COLACE) 100 MG capsule Take 100 mg by mouth 2 (two) times daily as needed for mild constipation.   Past Week at Unknown time  . feeding supplement, ENSURE ENLIVE, (ENSURE ENLIVE) LIQD Take 237 mLs by mouth 3 (three) times daily. 237 mL 12 Past Week at Unknown time  . ipratropium (ATROVENT HFA) 17 MCG/ACT inhaler Inhale 2 puffs into the lungs every 6 (six) hours as needed for wheezing.   Past Week at Unknown time  . metoprolol tartrate (LOPRESSOR) 25 MG tablet Take 12.5 mg by mouth 2 (two) times daily.   Past Week at Unknown time  . ondansetron (ZOFRAN-ODT) 4 MG disintegrating tablet Take 4 mg by mouth every 8 (eight) hours as needed for nausea or vomiting.   Past Week at Unknown time  . pantoprazole (PROTONIX) 40 MG tablet Take 1 tablet (40 mg total) by mouth 2 (two) times daily. 60 tablet 0 Past Week at Unknown time  . penicillin v potassium (VEETID) 500 MG tablet Take 1 tablet (500 mg total) by mouth 4 (four) times daily. 40 tablet 0 Past Week at Unknown time  . sodium chloride (OCEAN) 0.65 % SOLN nasal spray Place 1 spray into both nostrils as needed for congestion.   Past Week at Unknown time  . SUMAtriptan (IMITREX) 50 MG tablet Take 50 mg by mouth every 2 (two) hours as needed for migraine. May repeat in 2 hours if headache persists or recurs.   Past Week at  Unknown time     No Known Allergies   Past Medical History:  Diagnosis Date  . Bronchitis   . Hiatal hernia   . Hypertension   . Migraine     Review of systems:      Physical Exam    Heart and lungs: Regular rate and rhythm without rub or gallop, lungs are bilaterally clear.    HEENT: Normocephalic atraumatic eyes are anicteric    Other:     Pertinant exam for procedure: Soft nontender nondistended bowel sounds positive normoactive.    Planned proceedures: EGD, colonoscopy and indicated procedures. I have discussed the risks benefits and complications of procedures to  include not limited to bleeding, infection, perforation and the risk of sedation and the patient wishes to proceed.    Lollie Sails, MD Gastroenterology 07/15/2017  7:44 AM

## 2017-07-15 NOTE — Progress Notes (Signed)
Encouraged patient to see dermatologist about a brown spot on the back of her right leg. She says she has an appointment with a dermatologist December 3rd.

## 2017-07-15 NOTE — Anesthesia Procedure Notes (Signed)
Performed by: COOK-MARTIN, Lysbeth Dicola Pre-anesthesia Checklist: Patient identified, Emergency Drugs available, Suction available, Patient being monitored and Timeout performed Patient Re-evaluated:Patient Re-evaluated prior to induction Oxygen Delivery Method: Nasal cannula Preoxygenation: Pre-oxygenation with 100% oxygen Induction Type: IV induction Airway Equipment and Method: Bite block Placement Confirmation: CO2 detector and positive ETCO2       

## 2017-07-15 NOTE — Transfer of Care (Signed)
Immediate Anesthesia Transfer of Care Note  Patient: Mia Gray  Procedure(s) Performed: ESOPHAGOGASTRODUODENOSCOPY (EGD) WITH PROPOFOL (N/A ) COLONOSCOPY WITH PROPOFOL (N/A )  Patient Location: PACU  Anesthesia Type:General  Level of Consciousness: awake and sedated  Airway & Oxygen Therapy: Patient Spontanous Breathing and Patient connected to nasal cannula oxygen  Post-op Assessment: Report given to RN and Post -op Vital signs reviewed and stable  Post vital signs: Reviewed and stable  Last Vitals:  Vitals:   07/15/17 0635  BP: 118/84  Pulse: (!) 107  Resp: 16  Temp: (!) 36.1 C  SpO2: 99%    Last Pain:  Vitals:   07/15/17 0635  TempSrc: Tympanic  PainSc: 10-Worst pain ever         Complications: No apparent anesthesia complications

## 2017-07-15 NOTE — Anesthesia Postprocedure Evaluation (Signed)
Anesthesia Post Note  Patient: Mia Gray  Procedure(s) Performed: ESOPHAGOGASTRODUODENOSCOPY (EGD) WITH PROPOFOL (N/A ) COLONOSCOPY WITH PROPOFOL (N/A )  Patient location during evaluation: Endoscopy Anesthesia Type: General Level of consciousness: awake and alert Pain management: pain level controlled Vital Signs Assessment: post-procedure vital signs reviewed and stable Respiratory status: spontaneous breathing, nonlabored ventilation, respiratory function stable and patient connected to nasal cannula oxygen Cardiovascular status: blood pressure returned to baseline and stable Postop Assessment: no apparent nausea or vomiting Anesthetic complications: no Comments: Patient evaluated in GI PACU for small oral mucosal injury. This probably happened during the upper endoscopy either from the procedure or from oral suctioning.  The patient was suctioned by both the anesthesia team and the procedure team during that time. Patient was instructed that if this does not get better, gets worse or if she has any other concerns that she should contact a doctor or go to the ED. This was explained to the patient who voiced understanding.     Last Vitals:  Vitals:   07/15/17 0910 07/15/17 0920  BP: 102/63 117/66  Pulse: (!) 104 99  Resp: 15 (!) 22  Temp:    SpO2: 98% 96%    Last Pain:  Vitals:   07/15/17 0910  TempSrc:   PainSc: 1                  Precious Haws [employer]

## 2017-07-15 NOTE — Op Note (Signed)
[facility] Gastroenterology Patient Name: Mia Gray Procedure Date: 07/15/2017 7:14 AM MRN: [id number] Account #: [id number] Date of Birth: 08/01/1956 Admit Type: Inpatient Age: 61 Room: [facility] ENDO ROOM 3 Gender: Female Note Status: Finalized Procedure:            Colonoscopy Indications:          Rectal bleeding, Constipation Providers:            Lollie Sails, MD Referring MD:         Baxter Kail. Rebeca Alert MD, MD (Referring MD) Medicines:            Monitored Anesthesia Care Complications:        No immediate complications. Noted as above. Procedure:            Pre-Anesthesia Assessment:                       - ASA Grade Assessment: III - A patient with severe                        systemic disease.                       After obtaining informed consent, the colonoscope was                        passed under direct vision. Throughout the procedure,                        the patient's blood pressure, pulse, and oxygen                        saturations were monitored continuously. The                        Colonoscope was introduced through the anus with the                        intention of advancing to the cecum. The scope was                        advanced to the transverse colon before the procedure                        was aborted. Medications were given. The colonoscopy                        was extremely difficult due to a tortuous colon.                        Successful completion of the procedure was aided by                        changing the patient to a supine position, changing the                        patient to a prone position and using manual pressure.                        The quality of the bowel preparation was good. Findings:  A 5 mm polyp was found in the distal transverse colon. The polyp was       sessile. The polyp was removed with a cold snare. Resection and       retrieval were complete.      A 2 mm polyp was  found in the distal transverse colon. The polyp was       sessile. The polyp was removed with a cold biopsy forceps. Resection and       retrieval were complete.      Multiple medium-mouthed diverticula were found in the sigmoid colon,       descending colon and transverse colon.      The colon (entire examined portion) was significantly redundant.      I advanced the scope to about the proximal transverse colon, noting some       difficulty with passage. I aborted due to difficulty passing the scope       further. On egress of the scope I nored some sub mucosal and mucosal       ecchymosis in the area of the sigmoid colon, without evidence of mucosal       or other disruption.      External and internal hemorrhoids were found during retroflexion and       during anoscopy. The hemorrhoids were small.      Incidental finding-patient has a pigmented skin lesion on the posterior       right thigh. about 1.8 cm in size. Impression:           - One 5 mm polyp in the distal transverse colon,                        removed with a cold snare. Resected and retrieved.                       - One 2 mm polyp in the distal transverse colon,                        removed with a cold biopsy forceps. Resected and                        retrieved.                       - Diverticulosis in the sigmoid colon, in the                        descending colon and in the transverse colon.                       - Redundant colon. Recommendation:       - Await pathology results.                       - Perform an air contrast barium enema in 1 month.                       - Refer to dermatologist. at appointment to be                        scheduled. Procedure Code(s):    --- Professional ---  09233, 52, Colonoscopy, flexible; with removal of                        tumor(s), polyp(s), or other lesion(s) by snare                        technique                       45380, 59,52,  Colonoscopy, flexible; with biopsy,                        single or multiple Diagnosis Code(s):    --- Professional ---                       D12.3, Benign neoplasm of transverse colon (hepatic                        flexure or splenic flexure)                       K62.5, Hemorrhage of anus and rectum                       K59.00, Constipation, unspecified                       K57.30, Diverticulosis of large intestine without                        perforation or abscess without bleeding                       Q43.8, Other specified congenital malformations of                        intestine CPT copyright 2016 American Medical Association. All rights reserved. The codes documented in this report are preliminary and upon coder review may  be revised to meet current compliance requirements. Lollie Sails, MD 07/15/2017 8:59:14 AM This report has been signed electronically. Number of Addenda: 0 Note Initiated On: 07/15/2017 7:14 AM Total Procedure Duration: 0 hours 27 minutes 59 seconds       [facility]

## 2017-07-15 NOTE — Op Note (Signed)
[facility] Gastroenterology Patient Name: Mia Gray Procedure Date: 07/15/2017 7:17 AM MRN: [id number] Account #: [id number] Date of Birth: [birth year] Admit Type: Outpatient Age: 61 Room: [facility] ENDO ROOM 3 Gender: Female Note Status: Finalized Procedure:            Upper GI endoscopy Indications:          Abdominal pain in the left lower quadrant Providers:            Lollie Sails, MD Referring MD:         Baxter Kail. Rebeca Alert MD, MD (Referring MD) Medicines:            Monitored Anesthesia Care Complications:        No immediate complications. Procedure:            Pre-Anesthesia Assessment:                       - ASA Grade Assessment: III - A patient with severe                        systemic disease.                       After obtaining informed consent, the endoscope was                        passed under direct vision. Throughout the procedure,                        the patient's blood pressure, pulse, and oxygen                        saturations were monitored continuously. The Endoscope                        was introduced through the mouth, and advanced to the                        third part of duodenum. The upper GI endoscopy was                        accomplished without difficulty. The patient tolerated                        the procedure well. The patient tolerated the procedure                        well. Findings:      The Z-line was irregular. Biopsies were taken with a cold forceps for       histology.      The exam of the esophagus was otherwise normal.      Diffuse mild inflammation characterized by congestion (edema) and       erythema was found in the entire examined stomach. Biopsies were taken       with a cold forceps for histology.      Patchy mild inflammation characterized by congestion (edema) and       erythema was found in the cardia. Biopsies were taken with a cold       forceps for histology.      Evidence of a  prior Nissen fundoplication was found  in the cardia and in       the gastric fundus. This was characterized by healthy appearing mucosa.      The cardia and gastric fundus were normal on retroflexion otherwise.      The examined duodenum was normal. Impression:           - Z-line irregular. Biopsied.                       - Gastritis. Biopsied.                       - Atrophic gastritis. Biopsied.                       - A Nissen fundoplication was found, characterized by                        healthy appearing mucosa.                       - Normal examined duodenum. Recommendation:       - Perform a colonoscopy today.                       - Continue present medications.                       - Return to GI clinic in 5 weeks. Procedure Code(s):    --- Professional ---                       [phone], Esophagogastroduodenoscopy, flexible, transoral;                        with biopsy, single or multiple Diagnosis Code(s):    --- Professional ---                       K22.8, Other specified diseases of esophagus                       K29.70, Gastritis, unspecified, without bleeding                       K29.40, Chronic atrophic gastritis without bleeding                       Z98.890, Other specified postprocedural states                       R10.32, Left lower quadrant pain CPT copyright 2016 American Medical Association. All rights reserved. The codes documented in this report are preliminary and upon coder review may  be revised to meet current compliance requirements. Lollie Sails, MD 07/15/2017 8:13:52 AM This report has been signed electronically. Number of Addenda: 0 Note Initiated On: 07/15/2017 7:17 AM      [facility]

## 2017-07-15 NOTE — Anesthesia Preprocedure Evaluation (Signed)
Anesthesia Evaluation  Patient identified by MRN, date of birth, ID band Patient awake    Reviewed: Allergy & Precautions, H&P , NPO status , Patient's Chart, lab work & pertinent test results  Airway Mallampati: III  TM Distance: <3 FB Neck ROM: limited    Dental  (+) Poor Dentition, Chipped, Missing   Pulmonary pneumonia,           Cardiovascular Exercise Tolerance: Good hypertension,      Neuro/Psych  Headaches,  Neuromuscular disease negative psych ROS   GI/Hepatic Neg liver ROS, hiatal hernia,   Endo/Other  negative endocrine ROS  Renal/GU negative Renal ROS  negative genitourinary   Musculoskeletal   Abdominal   Peds  Hematology negative hematology ROS (+)   Anesthesia Other Findings Signs and symptoms suggestive of sleep apnea   Past Medical History: No date: Bronchitis No date: Hiatal hernia No date: Hypertension No date: Migraine  Past Surgical History: 10/15/2016: BREAST BIOPSY; Right     Comment:  path pending 10+ yrs ago: BREAST CYST ASPIRATION; Right     Comment:  negative No date: BREAST SURGERY; Right     Comment:  lumpectomy No date: CHOLECYSTECTOMY     Comment:  [facility] No date: haital hernia No date: HERNIA REPAIR; Right     Comment:  x 2 No date: HERNIA REPAIR; Left     Comment:  [place]  BMI    Body Mass Index:  31.09 kg/m      Reproductive/Obstetrics negative OB ROS                             Anesthesia Physical Anesthesia Plan  ASA: III  Anesthesia Plan: General   Post-op Pain Management:    Induction: Intravenous  PONV Risk Score and Plan: Propofol infusion  Airway Management Planned: Natural Airway and Nasal Cannula  Additional Equipment:   Intra-op Plan:   Post-operative Plan:   Informed Consent: I have reviewed the patients History and Physical, chart, labs and discussed the procedure including the risks, benefits and  alternatives for the proposed anesthesia with the patient or authorized representative who has indicated his/her understanding and acceptance.   Dental Advisory Given  Plan Discussed with: Anesthesiologist, CRNA and Surgeon  Anesthesia Plan Comments: (Patient consented for risks of anesthesia including but not limited to:  - adverse reactions to medications - risk of intubation if required - damage to teeth, lips or other oral mucosa - sore throat or hoarseness - Damage to heart, brain, lungs or loss of life  Patient voiced understanding.)        Anesthesia Quick Evaluation

## 2017-07-16 ENCOUNTER — Encounter: Payer: Self-pay | Admitting: Gastroenterology

## 2017-07-16 ENCOUNTER — Other Ambulatory Visit: Payer: Self-pay | Admitting: Gastroenterology

## 2017-07-16 DIAGNOSIS — Q438 Other specified congenital malformations of intestine: Secondary | ICD-10-CM

## 2017-07-16 LAB — SURGICAL PATHOLOGY

## 2017-08-11 ENCOUNTER — Ambulatory Visit
Admission: RE | Admit: 2017-08-11 | Discharge: 2017-08-11 | Disposition: A | Discharge status: Home / Self Care | Payer: BLUE CROSS/BLUE SHIELD | Source: Ambulatory Visit | Attending: Gastroenterology | Admitting: Gastroenterology

## 2017-08-11 DIAGNOSIS — K449 Diaphragmatic hernia without obstruction or gangrene: Secondary | ICD-10-CM | POA: Insufficient documentation

## 2017-08-11 DIAGNOSIS — Q438 Other specified congenital malformations of intestine: Secondary | ICD-10-CM

## 2017-09-25 ENCOUNTER — Other Ambulatory Visit: Payer: Self-pay | Admitting: Family Medicine

## 2017-09-25 DIAGNOSIS — Z1231 Encounter for screening mammogram for malignant neoplasm of breast: Secondary | ICD-10-CM

## 2017-10-14 ENCOUNTER — Ambulatory Visit
Admission: RE | Admit: 2017-10-14 | Discharge: 2017-10-14 | Disposition: A | Discharge status: Home / Self Care | Payer: BLUE CROSS/BLUE SHIELD | Source: Ambulatory Visit | Attending: Family Medicine | Admitting: Family Medicine

## 2017-10-14 DIAGNOSIS — Z1231 Encounter for screening mammogram for malignant neoplasm of breast: Secondary | ICD-10-CM | POA: Diagnosis not present

## 2017-10-21 DIAGNOSIS — C439 Malignant melanoma of skin, unspecified: Secondary | ICD-10-CM

## 2017-10-21 HISTORY — DX: Malignant melanoma of skin, unspecified: C43.9

## 2018-07-31 ENCOUNTER — Other Ambulatory Visit: Payer: Self-pay

## 2018-07-31 ENCOUNTER — Emergency Department
Admission: EM | Admit: 2018-07-31 | Discharge: 2018-07-31 | Disposition: A | Discharge status: Home / Self Care | Payer: BLUE CROSS/BLUE SHIELD | Attending: Emergency Medicine | Admitting: Emergency Medicine

## 2018-07-31 ENCOUNTER — Emergency Department: Payer: BLUE CROSS/BLUE SHIELD

## 2018-07-31 ENCOUNTER — Encounter: Payer: Self-pay | Admitting: Emergency Medicine

## 2018-07-31 DIAGNOSIS — I10 Essential (primary) hypertension: Secondary | ICD-10-CM | POA: Insufficient documentation

## 2018-07-31 DIAGNOSIS — R4020 Unspecified coma: Secondary | ICD-10-CM | POA: Diagnosis not present

## 2018-07-31 DIAGNOSIS — Z79899 Other long term (current) drug therapy: Secondary | ICD-10-CM | POA: Insufficient documentation

## 2018-07-31 LAB — BASIC METABOLIC PANEL
ANION GAP: 9 (ref 5–15)
BUN: 12 mg/dL (ref 8–23)
CALCIUM: 8.9 mg/dL (ref 8.9–10.3)
CO2: 27 mmol/L (ref 22–32)
Chloride: 104 mmol/L (ref 98–111)
Creatinine, Ser: 0.63 mg/dL (ref 0.44–1.00)
GFR calc Af Amer: >60 mL/min (ref >60)
GLUCOSE: 96 mg/dL (ref 70–99)
Potassium: 4 mmol/L (ref 3.5–5.1)
Sodium: 140 mmol/L (ref 135–145)

## 2018-07-31 LAB — CBC
HCT: 41.8 % (ref 36.0–46.0)
HEMOGLOBIN: 14.3 g/dL (ref 12.0–15.0)
MCH: 29.4 pg (ref 26.0–34.0)
MCHC: 34.2 g/dL (ref 30.0–36.0)
MCV: 85.8 fL (ref 80.0–100.0)
Platelets: 305 10*3/uL (ref 150–400)
RBC: 4.87 MIL/uL (ref 3.87–5.11)
RDW: 13.2 % (ref 11.5–15.5)
WBC: 8.5 10*3/uL (ref 4.0–10.5)
nRBC: 0 % (ref 0.0–0.2)

## 2018-07-31 LAB — URINALYSIS, COMPLETE (UACMP) WITH MICROSCOPIC
Bilirubin Urine: NEGATIVE
GLUCOSE, UA: NEGATIVE mg/dL
Ketones, ur: NEGATIVE mg/dL
Leukocytes, UA: NEGATIVE
NITRITE: NEGATIVE
PH: 5 (ref 5.0–8.0)
Protein, ur: NEGATIVE mg/dL
SPECIFIC GRAVITY, URINE: 1.023 (ref 1.005–1.030)

## 2018-07-31 LAB — GLUCOSE, CAPILLARY: Glucose-Capillary: 90 mg/dL (ref 70–99)

## 2018-07-31 NOTE — ED Notes (Signed)
LOC PTA

## 2018-07-31 NOTE — Discharge Instructions (Addendum)
It is possible that you had a seizure, or a syncope episode (fainting spell).  Your CT scan and labs today are normal.  Call your doctor on Monday to arrange for follow-up within the next 1 to 2 weeks.  Return to the ER immediately for new, worsening, recurrent episodes of loss of consciousness, weakness, severe headache, fever, or any other new or worsening symptoms that concern you.

## 2018-07-31 NOTE — ED Notes (Signed)
First Nurse Note: Pt to ED c/o syncopal episode. Pt in NAD

## 2018-07-31 NOTE — ED Triage Notes (Signed)
Pt reports around 2 pm she was doing some chores at home reports she does not recall how she got on bed reports tongue bitten and pain to teeth reports feeling weak, reports pain to left lower leg, and rib pain to left side pt reports she has no recollection if she fell or not. Pt talks in complete sentences no respiratory distress noted

## 2018-07-31 NOTE — ED Provider Notes (Addendum)
[facility] Emergency Department Provider Note ____________________________________________   First MD Initiated Contact with Patient 07/31/18 2207     (approximate)  I have reviewed the triage vital signs and the nursing notes.   HISTORY  Chief Complaint Weakness and Loss of Consciousness    HPI Mia Gray is a 62 y.o. female with PMH as noted below who presents with an episode of loss of consciousness, acute onset at 2:30 PM today, and now resolved.  The patient states that she was at home and her last members around that time, then she awoke on her bed when a family member called her.  She does not remember getting on the bed.  She states that when she awoke she had bitten her tongue.  She also had some pain in the back of her legs and in her epigastric area.  She states that these have all resolved.  She denies any prior history of this episode.  She felt fine this morning although she states she only ate a banana for breakfast.  She denies any weakness, lightheadedness, nausea, or other prodrome.   Past Medical History:  Diagnosis Date  . Bronchitis   . Hiatal hernia   . Hypertension   . Migraine     Patient Active Problem List   Diagnosis Date Noted  . GI bleed 06/08/2015  . Upper GI bleeding   . Hiatal hernia 12/07/2013    Past Surgical History:  Procedure Laterality Date  . BREAST BIOPSY Right 10/15/2016   CALCIFICATIONS WITHIN SCATTERED MICROCYSTS AND INVOLUTED LOBULES.  Marland Kitchen BREAST CYST ASPIRATION Right 10+ yrs ago   negative  . BREAST SURGERY Right    lumpectomy  . CHOLECYSTECTOMY     [facility]  . COLONOSCOPY WITH PROPOFOL N/A 07/15/2017   Procedure: COLONOSCOPY WITH PROPOFOL;  Surgeon: Lollie Sails, MD;  Location: [facility] ENDOSCOPY;  Service: Endoscopy;  Laterality: N/A;  . ESOPHAGOGASTRODUODENOSCOPY (EGD) WITH PROPOFOL N/A 07/15/2017   Procedure: ESOPHAGOGASTRODUODENOSCOPY (EGD) WITH PROPOFOL;  Surgeon: Lollie Sails, MD;   Location: [facility] ENDOSCOPY;  Service: Endoscopy;  Laterality: N/A;  . haital hernia    . HERNIA REPAIR Right    x 2  . HERNIA REPAIR Left    [place]    Prior to Admission medications   Medication Sig Start Date End Date Taking? Authorizing Provider  acetaminophen (TYLENOL) 325 MG tablet Take 2 tablets (650 mg total) by mouth 2 (two) times daily as needed for moderate pain or fever. 06/12/15   Vaughan Basta, MD  albuterol (PROVENTIL HFA;VENTOLIN HFA) 108 (90 BASE) MCG/ACT inhaler Inhale 1 puff into the lungs every 6 (six) hours as needed for wheezing or shortness of breath.    [provider]  amitriptyline (ELAVIL) 25 MG tablet Take 50 mg by mouth at bedtime.    [provider]  brompheniramine-pseudoephedrine-DM 30-2-10 MG/5ML syrup Take 5 mLs by mouth 4 (four) times daily as needed. 07/12/17   Johnn Hai, PA-C  cetirizine (ZYRTEC) 10 MG tablet Take 10 mg by mouth daily as needed for allergies.    [provider]  docusate sodium (COLACE) 100 MG capsule Take 100 mg by mouth 2 (two) times daily as needed for mild constipation.    [provider]  feeding supplement, ENSURE ENLIVE, (ENSURE ENLIVE) LIQD Take 237 mLs by mouth 3 (three) times daily. 06/12/15   Vaughan Basta, MD  ipratropium (ATROVENT HFA) 17 MCG/ACT inhaler Inhale 2 puffs into the lungs every 6 (six) hours  as needed for wheezing.    [provider]  metoprolol tartrate (LOPRESSOR) 25 MG tablet Take 12.5 mg by mouth 2 (two) times daily.    [provider]  ondansetron (ZOFRAN-ODT) 4 MG disintegrating tablet Take 4 mg by mouth every 8 (eight) hours as needed for nausea or vomiting.    [provider]  pantoprazole (PROTONIX) 40 MG tablet Take 1 tablet (40 mg total) by mouth 2 (two) times daily. 06/12/15   Vaughan Basta, MD  penicillin v potassium (VEETID) 500 MG tablet Take 1 tablet (500 mg total) by mouth 4 (four) times daily. 07/14/16    Johnn Hai, PA-C  sodium chloride (OCEAN) 0.65 % SOLN nasal spray Place 1 spray into both nostrils as needed for congestion.    [provider]  SUMAtriptan (IMITREX) 50 MG tablet Take 50 mg by mouth every 2 (two) hours as needed for migraine. May repeat in 2 hours if headache persists or recurs.    [provider]    Allergies Hydrocodone  Family History  Problem Relation Age of Onset  . Cancer Mother        breast and colon  . Stroke Mother   . Breast cancer Mother     Social History Social History   Tobacco Use  . Smoking status: Never Smoker  . Smokeless tobacco: Never Used  Substance Use Topics  . Alcohol use: No  . Drug use: No    Review of Systems  Constitutional: No fever. Eyes: No visual changes. ENT: No sore throat. Cardiovascular: Denies chest pain. Respiratory: Denies shortness of breath. Gastrointestinal: No vomiting. Genitourinary: Negative for dysuria.  Musculoskeletal: Negative for back pain. Skin: Negative for rash. Neurological: Negative for headaches, focal weakness or numbness.   ____________________________________________   PHYSICAL EXAM:  VITAL SIGNS: ED Triage Vitals  Enc Vitals Group     BP 07/31/18 1924 (!) 162/96     Pulse Rate 07/31/18 1924 (!) 102     Resp 07/31/18 1924 20     Temp 07/31/18 1924 97.8 F (36.6 C)     Temp Source 07/31/18 1924 Oral     SpO2 07/31/18 1924 97 %     Weight 07/31/18 1928 170 lb (77.1 kg)     Height 07/31/18 1928 5\' 2"  (1.575 m)     Head Circumference --      Peak Flow --      Pain Score 07/31/18 1927 8     Pain Loc --      Pain Edu? --      Excl. in [place]? --     Constitutional: Alert and oriented. Well appearing and in no acute distress. Eyes: Conjunctivae are normal.  Head: Atraumatic. Nose: No congestion/rhinnorhea. Mouth/Throat: Mucous membranes are moist.   Neck: Normal range of motion.  Cardiovascular:  Good peripheral circulation. Respiratory: Normal  respiratory effort.  Gastrointestinal: Soft and nontender. No distention.  Genitourinary: No flank tenderness. Musculoskeletal: No lower extremity edema.  Extremities warm and well perfused.  Neurologic:  Normal speech and language.  Motor and sensory intact in all extremities.  Cranial nerves III through XII intact.  Normal coordination with no ataxia.  No gross focal neurologic deficits are appreciated.  Skin:  Skin is warm and dry. No rash noted. Psychiatric: Mood and affect are normal. Speech and behavior are normal.  ____________________________________________   LABS (all labs ordered are listed, but only abnormal results are displayed)  Labs Reviewed  URINALYSIS, COMPLETE (UACMP) WITH MICROSCOPIC - Abnormal;  Notable for the following components:      Result Value   Color, Urine YELLOW (*)    APPearance CLEAR (*)    Hgb urine dipstick MODERATE (*)    Bacteria, UA RARE (*)    All other components within normal limits  BASIC METABOLIC PANEL  CBC  GLUCOSE, CAPILLARY  CBG MONITORING, ED   ____________________________________________  EKG  ED ECG REPORT I, Arta Silence, the attending physician, personally viewed and interpreted this ECG.  Date: 07/31/2018 EKG Time: 1921 Rate: 106 Rhythm: Sinus tachycardia QRS Axis: normal Intervals: normal ST/T Wave abnormalities: normal Narrative Interpretation: no evidence of acute ischemia  ____________________________________________  RADIOLOGY  CT head no ICH or other acute intracranial abnormality  ____________________________________________   PROCEDURES  Procedure(s) performed: No  Procedures  Critical Care performed: No ____________________________________________   INITIAL IMPRESSION / ASSESSMENT AND PLAN / ED COURSE  Pertinent labs & imaging results that were available during my care of the patient were reviewed by me and considered in my medical decision making (see chart for details).  62 year old  female with PMH as noted above presents with an episode of loss of consciousness earlier this afternoon.  The patient remembers being in her house and then the next thing she knows she awoke on her bed with her family member calling her on the phone.  She had bitten her tongue and had some soreness in her legs and her upper abdomen.  On exam currently, her vital signs are normal.  Her exam is completely unremarkable.  Neuro exam is normal.  EKG is nonischemic.  Lab work-up and CT were obtained from triage and are all negative for acute findings.  Overall given the tongue biting and lack of prodrome, the presentation is most consistent with a seizure.  The patient has no prior seizure history.  The work-up has ruled out specific precipitating causes.  Differential also includes syncope although this would be less likely given the lack of prodrome.  At this time given that the patient has been asymptomatic for almost 8 hours and her work-up is totally negative, she is stable for discharge home.  I counseled her on the results of the work-up and the likely causes.  At this time she does not need to start any antiepileptic.  I instructed her to follow-up with her doctor within the next week.  I gave her thorough return precautions and she expressed understanding.   ____________________________________________   FINAL CLINICAL IMPRESSION(S) / ED DIAGNOSES  Final diagnoses:  Loss of consciousness ([place])      NEW MEDICATIONS STARTED DURING THIS VISIT:  New Prescriptions   No medications on file     Note:  This document was prepared using Dragon voice recognition software and may include unintentional dictation errors.       Arta Silence, MD 07/31/18 2251

## 2018-09-09 ENCOUNTER — Ambulatory Visit: Payer: No Typology Code available for payment source

## 2018-09-29 ENCOUNTER — Ambulatory Visit: Payer: No Typology Code available for payment source | Admitting: Gerontology

## 2018-09-30 ENCOUNTER — Ambulatory Visit: Payer: No Typology Code available for payment source | Admitting: Gerontology

## 2018-09-30 ENCOUNTER — Other Ambulatory Visit: Payer: Self-pay

## 2018-09-30 DIAGNOSIS — Z5329 Procedure and treatment not carried out because of patient's decision for other reasons: Secondary | ICD-10-CM

## 2018-09-30 DIAGNOSIS — Z91199 Patient's noncompliance with other medical treatment and regimen due to unspecified reason: Secondary | ICD-10-CM

## 2018-09-30 NOTE — Progress Notes (Signed)
Unable to continue with office visit today. Patient has Medicare.

## 2018-11-06 ENCOUNTER — Other Ambulatory Visit: Payer: Self-pay | Admitting: Family Medicine

## 2018-11-06 DIAGNOSIS — Z0000 Encounter for general adult medical examination without abnormal findings: Secondary | ICD-10-CM

## 2018-11-23 ENCOUNTER — Other Ambulatory Visit: Payer: Self-pay | Admitting: Family Medicine

## 2018-11-23 DIAGNOSIS — Z1231 Encounter for screening mammogram for malignant neoplasm of breast: Secondary | ICD-10-CM

## 2018-12-17 ENCOUNTER — Other Ambulatory Visit: Payer: BLUE CROSS/BLUE SHIELD

## 2019-01-28 ENCOUNTER — Other Ambulatory Visit: Payer: Self-pay

## 2019-03-24 ENCOUNTER — Emergency Department: Payer: Medicare Other

## 2019-03-24 ENCOUNTER — Emergency Department
Admission: EM | Admit: 2019-03-24 | Discharge: 2019-03-24 | Disposition: A | Discharge status: Home / Self Care | Payer: Medicare Other | Attending: Emergency Medicine | Admitting: Emergency Medicine

## 2019-03-24 ENCOUNTER — Encounter: Payer: Self-pay | Admitting: Emergency Medicine

## 2019-03-24 ENCOUNTER — Other Ambulatory Visit: Payer: Self-pay

## 2019-03-24 DIAGNOSIS — E162 Hypoglycemia, unspecified: Secondary | ICD-10-CM | POA: Diagnosis not present

## 2019-03-24 DIAGNOSIS — Z79899 Other long term (current) drug therapy: Secondary | ICD-10-CM | POA: Diagnosis not present

## 2019-03-24 DIAGNOSIS — I10 Essential (primary) hypertension: Secondary | ICD-10-CM | POA: Diagnosis not present

## 2019-03-24 DIAGNOSIS — R569 Unspecified convulsions: Secondary | ICD-10-CM | POA: Diagnosis present

## 2019-03-24 DIAGNOSIS — G4089 Other seizures: Secondary | ICD-10-CM | POA: Diagnosis not present

## 2019-03-24 LAB — CBC WITH DIFFERENTIAL/PLATELET
Abs Immature Granulocytes: 0.05 10*3/uL (ref 0.00–0.07)
Basophils Absolute: 0.1 10*3/uL (ref 0.0–0.1)
Basophils Relative: 1 %
Eosinophils Absolute: 0.1 10*3/uL (ref 0.0–0.5)
Eosinophils Relative: 1 %
HCT: 41.6 % (ref 36.0–46.0)
Hemoglobin: 14.5 g/dL (ref 12.0–15.0)
Immature Granulocytes: 1 %
Lymphocytes Relative: 10 %
Lymphs Abs: 0.9 10*3/uL (ref 0.7–4.0)
MCH: 30.6 pg (ref 26.0–34.0)
MCHC: 34.9 g/dL (ref 30.0–36.0)
MCV: 87.8 fL (ref 80.0–100.0)
Monocytes Absolute: 0.5 10*3/uL (ref 0.1–1.0)
Monocytes Relative: 6 %
Neutro Abs: 7.7 10*3/uL (ref 1.7–7.7)
Neutrophils Relative %: 81 %
Platelets: 273 10*3/uL (ref 150–400)
RBC: 4.74 MIL/uL (ref 3.87–5.11)
RDW: 13.1 % (ref 11.5–15.5)
WBC: 9.3 10*3/uL (ref 4.0–10.5)
nRBC: 0 % (ref 0.0–0.2)

## 2019-03-24 LAB — BASIC METABOLIC PANEL
Anion gap: 11 (ref 5–15)
BUN: 17 mg/dL (ref 8–23)
CO2: 22 mmol/L (ref 22–32)
Calcium: 8.8 mg/dL — ABNORMAL LOW (ref 8.9–10.3)
Chloride: 105 mmol/L (ref 98–111)
Creatinine, Ser: 0.86 mg/dL (ref 0.44–1.00)
GFR calc Af Amer: >60 mL/min (ref >60)
GFR calc non Af Amer: >60 mL/min (ref >60)
Glucose, Bld: 106 mg/dL — ABNORMAL HIGH (ref 70–99)
Potassium: 4.5 mmol/L (ref 3.5–5.1)
Sodium: 138 mmol/L (ref 135–145)

## 2019-03-24 LAB — URINALYSIS, COMPLETE (UACMP) WITH MICROSCOPIC
Bacteria, UA: NONE SEEN
Bilirubin Urine: NEGATIVE
Glucose, UA: NEGATIVE mg/dL
Ketones, ur: NEGATIVE mg/dL
Leukocytes,Ua: NEGATIVE
Nitrite: NEGATIVE
Protein, ur: NEGATIVE mg/dL
Specific Gravity, Urine: 1.015 (ref 1.005–1.030)
pH: 5 (ref 5.0–8.0)

## 2019-03-24 LAB — GLUCOSE, CAPILLARY
Glucose-Capillary: 87 mg/dL (ref 70–99)
Glucose-Capillary: 91 mg/dL (ref 70–99)
Glucose-Capillary: 88 mg/dL (ref 70–99)

## 2019-03-24 LAB — TROPONIN I (HIGH SENSITIVITY): Troponin I (High Sensitivity): <2 ng/L (ref <18)

## 2019-03-24 NOTE — ED Provider Notes (Signed)
[facility] Emergency Department Provider Note ____________________________________________   First MD Initiated Contact with Patient 03/24/19 1229     (approximate)  I have reviewed the triage vital signs and the nursing notes.   HISTORY  Chief Complaint Loss of Consciousness and Hypoglycemia    HPI Mia Gray is a 63 y.o. female with PMH as noted below who presents after possible seizure.  She was sitting in her car at that time.  She was noted to have generalized seizure-like activity for about 1 minute and was then confused.  She currently is asymptomatic.  It was witnessed by her son.  EMS noted that she was hypoglycemic on their arrival.  The patient denies being on insulin or any other diabetic medications.  She denies any diagnosis of seizures, but states that she had one episode last fall when she was at home and woke up after having syncopized.  Both then and today she has pain as if she bit her tongue.  She denies any bowel or bladder incontinence today.  She has no headache or any injuries.  She denies any new medications, and denies alcohol or drug use.  Past Medical History:  Diagnosis Date  . Bronchitis   . Hiatal hernia   . Hypertension   . Migraine     Patient Active Problem List   Diagnosis Date Noted  . GI bleed 06/08/2015  . Upper GI bleeding   . Hiatal hernia 12/07/2013    Past Surgical History:  Procedure Laterality Date  . BREAST BIOPSY Right 10/15/2016   CALCIFICATIONS WITHIN SCATTERED MICROCYSTS AND INVOLUTED LOBULES.  Marland Kitchen BREAST CYST ASPIRATION Right 10+ yrs ago   negative  . BREAST SURGERY Right    lumpectomy  . CHOLECYSTECTOMY     [facility]  . COLONOSCOPY WITH PROPOFOL N/A 07/15/2017   Procedure: COLONOSCOPY WITH PROPOFOL;  Surgeon: Lollie Sails, MD;  Location: [facility] ENDOSCOPY;  Service: Endoscopy;  Laterality: N/A;  . ESOPHAGOGASTRODUODENOSCOPY (EGD) WITH PROPOFOL N/A 07/15/2017   Procedure:  ESOPHAGOGASTRODUODENOSCOPY (EGD) WITH PROPOFOL;  Surgeon: Lollie Sails, MD;  Location: [facility] ENDOSCOPY;  Service: Endoscopy;  Laterality: N/A;  . haital hernia    . HERNIA REPAIR Right    x 2  . HERNIA REPAIR Left    [place]    Prior to Admission medications   Medication Sig Start Date End Date Taking? Authorizing Provider  acetaminophen (TYLENOL) 325 MG tablet Take 2 tablets (650 mg total) by mouth 2 (two) times daily as needed for moderate pain or fever. 06/12/15   Vaughan Basta, MD  albuterol (PROVENTIL HFA;VENTOLIN HFA) 108 (90 BASE) MCG/ACT inhaler Inhale 1 puff into the lungs every 6 (six) hours as needed for wheezing or shortness of breath.    [provider]  amitriptyline (ELAVIL) 25 MG tablet Take 50 mg by mouth at bedtime.    [provider]  brompheniramine-pseudoephedrine-DM 30-2-10 MG/5ML syrup Take 5 mLs by mouth 4 (four) times daily as needed. Patient not taking: Reported on 09/30/2018 07/12/17   Johnn Hai, PA-C  cetirizine (ZYRTEC) 10 MG tablet Take 10 mg by mouth daily as needed for allergies.    [provider]  docusate sodium (COLACE) 100 MG capsule Take 100 mg by mouth 2 (two) times daily as needed for mild constipation.    [provider]  feeding supplement, ENSURE ENLIVE, (ENSURE ENLIVE) LIQD Take 237 mLs by mouth 3 (three) times daily. Patient not taking: Reported on 09/30/2018 06/12/15  Vaughan Basta, MD  ipratropium (ATROVENT HFA) 17 MCG/ACT inhaler Inhale 2 puffs into the lungs every 6 (six) hours as needed for wheezing.    [provider]  metoprolol tartrate (LOPRESSOR) 25 MG tablet Take 12.5 mg by mouth 2 (two) times daily.    [provider]  ondansetron (ZOFRAN-ODT) 4 MG disintegrating tablet Take 4 mg by mouth every 8 (eight) hours as needed for nausea or vomiting.    [provider]  pantoprazole (PROTONIX) 40 MG tablet Take 1 tablet (40 mg total) by mouth 2 (two)  times daily. Patient not taking: Reported on 09/30/2018 06/12/15   Vaughan Basta, MD  penicillin v potassium (VEETID) 500 MG tablet Take 1 tablet (500 mg total) by mouth 4 (four) times daily. Patient not taking: Reported on 09/30/2018 07/14/16   Johnn Hai, PA-C  sodium chloride (OCEAN) 0.65 % SOLN nasal spray Place 1 spray into both nostrils as needed for congestion.    [provider]  SUMAtriptan (IMITREX) 50 MG tablet Take 50 mg by mouth every 2 (two) hours as needed for migraine. May repeat in 2 hours if headache persists or recurs.    [provider]    Allergies Hydrocodone  Family History  Problem Relation Age of Onset  . Cancer Mother        breast and colon  . Stroke Mother   . Breast cancer Mother     Social History Social History   Tobacco Use  . Smoking status: Never Smoker  . Smokeless tobacco: Never Used  Substance Use Topics  . Alcohol use: No  . Drug use: No    Review of Systems  Constitutional: No fever. Eyes: No visual changes. ENT: No neck pain. Cardiovascular: Denies chest pain. Respiratory: Denies shortness of breath. Gastrointestinal: No vomiting or diarrhea.  Genitourinary: Negative for flank pain.  Musculoskeletal: Negative for back pain. Skin: Negative for rash. Neurological: Negative for headaches, focal weakness or numbness.   ____________________________________________   PHYSICAL EXAM:  VITAL SIGNS: ED Triage Vitals  Enc Vitals Group     BP 03/24/19 1126 137/79     Pulse Rate 03/24/19 1126 (!) 106     Resp 03/24/19 1126 16     Temp 03/24/19 1126 98.3 F (36.8 C)     Temp Source 03/24/19 1126 Oral     SpO2 03/24/19 1126 96 %     Weight 03/24/19 1127 170 lb (77.1 kg)     Height 03/24/19 1127 5\' 2"  (1.575 m)     Head Circumference --      Peak Flow --      Pain Score 03/24/19 1143 0     Pain Loc --      Pain Edu? --      Excl. in [place]? --     Constitutional: Alert and oriented.  Relatively well  appearing and in no acute distress. Eyes: Conjunctivae are normal.  EOMI.  PERRLA.   Head: Atraumatic. Nose: No congestion/rhinnorhea. Mouth/Throat: Mucous membranes are moist.   Neck: Normal range of motion.  No midline cervical spinal tenderness. Cardiovascular: Normal rate, regular rhythm. Grossly normal heart sounds.  Good peripheral circulation. Respiratory: Normal respiratory effort.  No retractions. Lungs CTAB. Gastrointestinal:  No distention.  Musculoskeletal:  Extremities warm and well perfused.  Neurologic:  Normal speech and language.  Motor and sensory intact in all extremities.  Normal coordination with no ataxia on finger-to-nose.  No pronator drift.  Cranial nerves grossly intact. Skin:  Skin is  warm and dry. No rash noted. Psychiatric: Mood and affect are normal. Speech and behavior are normal.  ____________________________________________   LABS (all labs ordered are listed, but only abnormal results are displayed)  Labs Reviewed  BASIC METABOLIC PANEL - Abnormal; Notable for the following components:      Result Value   Glucose, Bld 106 (*)    Calcium 8.8 (*)    All other components within normal limits  URINALYSIS, COMPLETE (UACMP) WITH MICROSCOPIC - Abnormal; Notable for the following components:   Color, Urine YELLOW (*)    APPearance CLEAR (*)    Hgb urine dipstick SMALL (*)    All other components within normal limits  GLUCOSE, CAPILLARY  CBC WITH DIFFERENTIAL/PLATELET  TROPONIN I (HIGH SENSITIVITY)  GLUCOSE, CAPILLARY  TROPONIN I (HIGH SENSITIVITY)   ____________________________________________  EKG  ED ECG REPORT I, Arta Silence, the attending physician, personally viewed and interpreted this ECG.  Date: 03/24/2019 EKG Time: 1518 Rate: 88 Rhythm: normal sinus rhythm QRS Axis: Left axis Intervals: normal ST/T Wave abnormalities: normal Narrative Interpretation: no evidence of acute  ischemia  ____________________________________________  RADIOLOGY  CT head: No acute abnormality  ____________________________________________   PROCEDURES  Procedure(s) performed: No  Procedures  Critical Care performed: No ____________________________________________   INITIAL IMPRESSION / ASSESSMENT AND PLAN / ED COURSE  Pertinent labs & imaging results that were available during my care of the patient were reviewed by me and considered in my medical decision making (see chart for details).  64 year old female with PMH as noted above presents after an apparent seizure-like episode that was witnessed by her son and involve generalized seizure-like activity.  The patient bit her tongue and was confused afterwards.  She has no diagnosis of seizure disorder but did have one episode of syncope while at home last year and states that she also bit her tongue at that time.  I reviewed the past medical records in Epic and confirmed this.  The patient was seen in the ED on 07/31/2018 and had a negative work-up.  She was discharged home.  On exam currently, she is well-appearing.  Her vital signs are normal except for borderline tachycardia at triage.  Neurologic exam is nonfocal.  The remainder of the exam is unremarkable.  Overall presentation is consistent with generalized seizure although syncopal episode with seizure-like activity is also possible.  We will once again obtain CT head and lab work-up due to the patient's age.  If these are negative, the patient would strongly prefer to go home and I think that this is reasonable, however I will advise her not to drive and will give her neurology follow-up as she certainly will need an EEG as an outpatient.  ----------------------------------------- 3:02 PM on 03/24/2019 -----------------------------------------  The patient continues to be asymptomatic.  She is eating and maintaining a normal glucose.  She continues to feel well and  wants to go home.  I counseled her on the results of the work-up and my concern for possible seizures.  I recommended that she contact her PMD ASAP and I will also give her neurology referral.  I instructed her not to drive or operate heavy machinery until she follows up.  Return precautions given, and she expresses understanding. ____________________________________________   FINAL CLINICAL IMPRESSION(S) / ED DIAGNOSES  Final diagnoses:  Seizure-like activity ([place])      NEW MEDICATIONS STARTED DURING THIS VISIT:  New Prescriptions   No medications on file     Note:  This document was prepared  using Systems analyst and may include unintentional dictation errors.    Arta Silence, MD 03/24/19 1526

## 2019-03-24 NOTE — ED Triage Notes (Signed)
Pt in via POV.  Per pt's son, patient drove herself to car shop, while sitting in car, son reports episode of seizure like activity x 1 minute, with disorientation following.  EMS was called, pt found to be hypoglycemic on scene, unsure at this time what was given via EMS.  Pt is A/Ox4 upon arrival, unable to recall incident.  CBG 87, eating peanut butter crackers and soda during triage.    Denies hx of seizures; does report one similar incident in November but reports being at home by herself and did not seek medical attention when she woke up from syncopal episode.

## 2019-03-24 NOTE — ED Notes (Signed)
Patient reports having sz today. Md At bedside to assess. Patient aox4, urine obtained and sent. Vss. Trop drawn and sent

## 2019-03-24 NOTE — ED Notes (Signed)
BG 91

## 2019-03-24 NOTE — Discharge Instructions (Signed)
We have provided a referral to a neurologist.  You should contact your primary care doctor as soon as possible in order to arrange for follow-up within the next 1 to 2 weeks.  You will need an EEG and possible further work-up as an outpatient to determine whether or not you have had seizures.  Until that time we strongly advise you not to drive a vehicle or operate heavy machinery.  Return to the ER immediately for any new, worsening, or recurrent seizure-like episodes or any unexpected passing out, lightheadedness, spasms, fever, weakness, severe headache, or any other new or worsening symptoms that concern you.

## 2019-03-24 NOTE — ED Notes (Signed)
Awaiting ct head.

## 2019-03-30 ENCOUNTER — Ambulatory Visit
Admission: RE | Admit: 2019-03-30 | Discharge: 2019-03-30 | Disposition: A | Discharge status: Home / Self Care | Payer: Medicare Other | Source: Ambulatory Visit | Attending: Family Medicine | Admitting: Family Medicine

## 2019-03-30 ENCOUNTER — Other Ambulatory Visit: Payer: Self-pay

## 2019-03-30 DIAGNOSIS — Z13820 Encounter for screening for osteoporosis: Secondary | ICD-10-CM | POA: Insufficient documentation

## 2019-03-30 DIAGNOSIS — Z1231 Encounter for screening mammogram for malignant neoplasm of breast: Secondary | ICD-10-CM

## 2019-03-30 DIAGNOSIS — M8580 Other specified disorders of bone density and structure, unspecified site: Secondary | ICD-10-CM | POA: Diagnosis not present

## 2019-03-30 DIAGNOSIS — Z780 Asymptomatic menopausal state: Secondary | ICD-10-CM | POA: Insufficient documentation

## 2019-03-30 DIAGNOSIS — Z0000 Encounter for general adult medical examination without abnormal findings: Secondary | ICD-10-CM

## 2019-05-27 ENCOUNTER — Other Ambulatory Visit: Payer: Self-pay | Admitting: Neurology

## 2019-05-27 DIAGNOSIS — R569 Unspecified convulsions: Secondary | ICD-10-CM

## 2019-06-07 ENCOUNTER — Ambulatory Visit
Admission: RE | Admit: 2019-06-07 | Discharge: 2019-06-07 | Disposition: A | Discharge status: Home / Self Care | Payer: Medicare Other | Source: Ambulatory Visit | Attending: Neurology | Admitting: Neurology

## 2019-06-07 ENCOUNTER — Other Ambulatory Visit: Payer: Self-pay

## 2019-06-07 DIAGNOSIS — R569 Unspecified convulsions: Secondary | ICD-10-CM | POA: Diagnosis present

## 2019-06-07 LAB — POCT I-STAT CREATININE: Creatinine, Ser: 1 mg/dL (ref 0.44–1.00)

## 2019-06-07 MED ORDER — GADOBUTROL 1 MMOL/ML IV SOLN
7.0000 mL | Freq: Once | INTRAVENOUS | Status: AC | PRN
  Administered 2019-06-07: 7 mL via INTRAVENOUS

## 2019-11-12 ENCOUNTER — Ambulatory Visit: Payer: Medicare Other

## 2019-11-18 ENCOUNTER — Ambulatory Visit: Payer: Medicare Other | Attending: Internal Medicine

## 2019-11-18 DIAGNOSIS — Z23 Encounter for immunization: Secondary | ICD-10-CM | POA: Insufficient documentation

## 2019-11-18 NOTE — Progress Notes (Signed)
   Covid-19 Vaccination Clinic  Name:  Mia Gray    MRN: [id number] DOB: [birth year]  11/18/2019  [name] was observed post Covid-19 immunization for 15 minutes without incidence. She was provided with Vaccine Information Sheet and instruction to access the V-Safe system.   [name] was instructed to call 911 with any severe reactions post vaccine: Marland Kitchen Difficulty breathing  . Swelling of your face and throat  . A fast heartbeat  . A bad rash all over your body  . Dizziness and weakness    Immunizations Administered    Name Date Dose VIS Date Route   Pfizer COVID-19 Vaccine 11/18/2019  8:48 AM 0.3 mL 09/03/2019 Intramuscular   Manufacturer: [place]   Lot: [id number]   [place]: [id number]

## 2019-12-15 ENCOUNTER — Ambulatory Visit: Payer: Medicare Other | Attending: Internal Medicine

## 2019-12-15 DIAGNOSIS — Z23 Encounter for immunization: Secondary | ICD-10-CM

## 2019-12-15 NOTE — Progress Notes (Signed)
   Covid-19 Vaccination Clinic  Name:  Mia Gray    MRN: [id number] DOB: [birth year]  12/15/2019  [name] was observed post Covid-19 immunization for 15 minutes without incident. She was provided with Vaccine Information Sheet and instruction to access the V-Safe system.   [name] was instructed to call 911 with any severe reactions post vaccine: Marland Kitchen Difficulty breathing  . Swelling of face and throat  . A fast heartbeat  . A bad rash all over body  . Dizziness and weakness   Immunizations Administered    Name Date Dose VIS Date Route   Pfizer COVID-19 Vaccine 12/15/2019  8:04 AM 0.3 mL 09/03/2019 Intramuscular   Manufacturer: [place]   Lot: [id number]   [place]: [id number]

## 2020-01-05 ENCOUNTER — Other Ambulatory Visit: Payer: Self-pay

## 2020-01-05 ENCOUNTER — Encounter: Payer: Self-pay | Admitting: Dermatology

## 2020-01-05 ENCOUNTER — Ambulatory Visit (INDEPENDENT_AMBULATORY_CARE_PROVIDER_SITE_OTHER): Payer: Medicare Other | Admitting: Dermatology

## 2020-01-05 DIAGNOSIS — L578 Other skin changes due to chronic exposure to nonionizing radiation: Secondary | ICD-10-CM

## 2020-01-05 DIAGNOSIS — L814 Other melanin hyperpigmentation: Secondary | ICD-10-CM

## 2020-01-05 DIAGNOSIS — Z85820 Personal history of malignant melanoma of skin: Secondary | ICD-10-CM

## 2020-01-05 DIAGNOSIS — L821 Other seborrheic keratosis: Secondary | ICD-10-CM

## 2020-01-05 DIAGNOSIS — Z1283 Encounter for screening for malignant neoplasm of skin: Secondary | ICD-10-CM | POA: Diagnosis not present

## 2020-01-05 DIAGNOSIS — D1800 Hemangioma unspecified site: Secondary | ICD-10-CM

## 2020-01-05 DIAGNOSIS — T148XXA Other injury of unspecified body region, initial encounter: Secondary | ICD-10-CM

## 2020-01-05 DIAGNOSIS — S40211A Abrasion of right shoulder, initial encounter: Secondary | ICD-10-CM | POA: Diagnosis not present

## 2020-01-05 DIAGNOSIS — L570 Actinic keratosis: Secondary | ICD-10-CM

## 2020-01-05 DIAGNOSIS — D229 Melanocytic nevi, unspecified: Secondary | ICD-10-CM

## 2020-01-05 NOTE — Patient Instructions (Signed)
Melanoma  Melanoma is the most dangerous type of skin cancer, and is the leading cause of death from skin disease.  You are more likely to develop melanoma if you:  Have light-colored skin, light-colored eyes, or red or blond hair  Spend a lot of time in the sun  Tan regularly, either outdoors or in a tanning bed  Have had blistering sunburns, especially during childhood  Have a close family member who has had a melanoma  Have atypical moles or large birthmarks  The first sign of melanoma is often a change in a mole or a new dark spot.  The ABCDE system is a way of remembering the signs of melanoma. A for asymmetry:  The two halves do not match. B for border:  The edges of the growth are irregular. C for color:  A mixture of colors are present instead of an even brown color. D for diameter:  Melanomas are usually (but not always) greater than 6mm - the size of a pencil eraser. E for evolution:  The spot keeps changing in size, shape, and color. If your doctor thinks that you might have a skin cancer, he or she will perform a biopsy.  A piece of skin is removed and sent to a laboratory for examination under a microscope.  This is the best test for melanoma.  Melanoma is almost always treated with surgery.  The cancer and an area of skin surrounding it are removed.  For early melanomas, this is sometimes the only treatment that is necessary.  For some melanomas, a sentinel node biopsy will be performed.  When melanoma starts to spread to other parts of the body, it will usually go to a nearby lymph node first.  This is called the sentinel lymph node, and it can be found with a special x-ray test.  The surgeon can biopsy this lymph node to see if the cancer has already spread.  When melanoma has spread to other organs, treatment is much more difficult.  Metastatic melanoma is melanoma that is found elsewhere in the body.  It usually cannot be cured.  Treatment involves shrinking the cancer  spots for comfort and for the longest possible survival time.  This can be done with chemotherapy, immune therapies, radiation, or additional surgery.  How well a patient does depends on many things, but the most important of these is how quickly the cancer was found.  If caught early, melanoma can be cured.  Doctors use the depth of the melanoma as a measure of how likely it is that the melanoma has spread.  Melanomas less than 1mm deep in the skin have very little chance of having spread, whereas melanomas deeper than 4mm are more likely to have spread to other organs.  If you have had a melanoma and have been successfully treated, it is very important to examine your skin regularly for any changes.  Frequent skin exams by a doctor are recommended.  Often these are done every three months for the first two years after a melanoma, and every six to twelve months after that.  Your risk for melanoma is increased after you have had one.  Melanoma may return years later.  

## 2020-01-05 NOTE — Progress Notes (Signed)
   Follow-Up Visit   Subjective  Mia Gray is a 64 y.o. female who presents for the following: Follow-up (TBSE). 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 She also has a history of AKs.  The following portions of the chart were reviewed this encounter and updated as appropriate: Tobacco  Allergies  Meds  Problems  Med Hx  Surg Hx  Fam Hx      Review of Systems: No other skin or systemic complaints.  Objective  Well appearing patient in no apparent distress; mood and affect are within normal limits.  A full examination was performed including scalp, head, eyes, ears, nose, lips, neck, chest, axillae, abdomen, back, buttocks, bilateral upper extremities, bilateral lower extremities, hands, feet, fingers, toes, fingernails, and toenails. All findings within normal limits unless otherwise noted below.  Objective  Left Infraorbital x 1: Erythematous thin papules/macules with gritty scale.   Objective  Right Posterior Shoulder: Excoriation  Objective  Right Posterior Thigh: Well healed scar with no evidence of recurrence, no lymphadenopathy.   Assessment & Plan   Skin cancer screening performed today.  Actinic Damage - diffuse scaly erythematous macules with underlying dyspigmentation - Recommend daily broad spectrum sunscreen SPF 30+ to sun-exposed areas, reapply every 2 hours as needed.  - Call for new or changing lesions.  Lentigines - Scattered tan macules - Discussed due to sun exposure - Benign, observe - Call for any changes  Seborrheic Keratoses - Stuck-on, waxy, tan-brown papules and plaques  - Discussed benign etiology and prognosis. - Observe - Call for any changes  Melanocytic Nevi - Tan-brown and/or pink-flesh-colored symmetric macules and papules - Benign appearing on exam today - Observation - Call clinic for new or changing moles - Recommend daily use  of broad spectrum spf 30+ sunscreen to sun-exposed areas.   Hemangiomas - Red papules - Discussed benign nature - Observe - Call for any changes        AK (actinic keratosis) Left Infraorbital x 1  Destruction of lesion - Left Infraorbital x 1  Destruction method: cryotherapy   Informed consent: discussed and consent obtained   Lesion destroyed using liquid nitrogen: Yes   Region frozen until ice ball extended beyond lesion: Yes   Outcome: patient tolerated procedure well with no complications   Post-procedure details: wound care instructions given    Excoriation Right Posterior Shoulder  Should resolve with time. Benign, observe.    History of melanoma Right Posterior Thigh No lymphadenopathy. Clear. Observe for recurrence. Call clinic for new or changing lesions.  Recommend regular skin exams, daily broad-spectrum spf 30+ sunscreen use, and photoprotection.      Return in about 6 months (around 07/06/2020) for TBSE.   Lindi Adie, CMA, am acting as scribe for Sarina Ser, MD . Documentation: I have reviewed the above documentation for accuracy and completeness, and I agree with the above.  Sarina Ser, MD

## 2020-02-23 ENCOUNTER — Other Ambulatory Visit: Payer: Self-pay | Admitting: Internal Medicine

## 2020-02-23 ENCOUNTER — Other Ambulatory Visit: Payer: Self-pay | Admitting: Family Medicine

## 2020-02-23 DIAGNOSIS — Z1231 Encounter for screening mammogram for malignant neoplasm of breast: Secondary | ICD-10-CM

## 2020-03-30 ENCOUNTER — Ambulatory Visit
Admission: RE | Admit: 2020-03-30 | Discharge: 2020-03-30 | Disposition: A | Discharge status: Home / Self Care | Payer: Medicare Other | Source: Ambulatory Visit | Attending: Internal Medicine | Admitting: Internal Medicine

## 2020-03-30 DIAGNOSIS — Z1231 Encounter for screening mammogram for malignant neoplasm of breast: Secondary | ICD-10-CM

## 2020-04-17 ENCOUNTER — Other Ambulatory Visit
Admission: RE | Admit: 2020-04-17 | Discharge: 2020-04-17 | Disposition: A | Discharge status: Home / Self Care | Payer: Medicare Other | Source: Ambulatory Visit | Attending: Student | Admitting: Student

## 2020-04-17 DIAGNOSIS — R197 Diarrhea, unspecified: Secondary | ICD-10-CM | POA: Diagnosis present

## 2020-04-17 LAB — C DIFFICILE QUICK SCREEN W PCR REFLEX
C Diff antigen: NEGATIVE
C Diff interpretation: NOT DETECTED
C Diff toxin: NEGATIVE

## 2020-04-17 LAB — GASTROINTESTINAL PANEL BY PCR, STOOL (REPLACES STOOL CULTURE)

## 2020-04-18 LAB — CALPROTECTIN, FECAL: Calprotectin, Fecal: 45 ug/g (ref 0–120)

## 2020-04-19 LAB — PANCREATIC ELASTASE, FECAL: Pancreatic Elastase-1, Stool: 371 ug Elast./g (ref >200)

## 2020-06-28 ENCOUNTER — Encounter: Payer: Self-pay | Admitting: Dermatology

## 2020-06-28 ENCOUNTER — Other Ambulatory Visit: Payer: Self-pay

## 2020-06-28 ENCOUNTER — Ambulatory Visit (INDEPENDENT_AMBULATORY_CARE_PROVIDER_SITE_OTHER): Payer: Medicare Other | Admitting: Dermatology

## 2020-06-28 DIAGNOSIS — D1800 Hemangioma unspecified site: Secondary | ICD-10-CM

## 2020-06-28 DIAGNOSIS — L821 Other seborrheic keratosis: Secondary | ICD-10-CM

## 2020-06-28 DIAGNOSIS — L570 Actinic keratosis: Secondary | ICD-10-CM | POA: Diagnosis not present

## 2020-06-28 DIAGNOSIS — L814 Other melanin hyperpigmentation: Secondary | ICD-10-CM

## 2020-06-28 DIAGNOSIS — Z1283 Encounter for screening for malignant neoplasm of skin: Secondary | ICD-10-CM

## 2020-06-28 DIAGNOSIS — D229 Melanocytic nevi, unspecified: Secondary | ICD-10-CM | POA: Diagnosis not present

## 2020-06-28 DIAGNOSIS — L918 Other hypertrophic disorders of the skin: Secondary | ICD-10-CM

## 2020-06-28 DIAGNOSIS — L578 Other skin changes due to chronic exposure to nonionizing radiation: Secondary | ICD-10-CM

## 2020-06-28 DIAGNOSIS — Z85820 Personal history of malignant melanoma of skin: Secondary | ICD-10-CM | POA: Diagnosis not present

## 2020-06-28 NOTE — Progress Notes (Signed)
   Follow-Up Visit   Subjective  Mia Gray is a 64 y.o. female who presents for the following: Annual Exam (Hx MM R post thigh - tx in 2019, Hx AK's). The patient presents for Total-Body Skin Exam (TBSE) for skin cancer screening and mole check.  The following portions of the chart were reviewed this encounter and updated as appropriate:  Tobacco  Allergies  Meds  Problems  Med Hx  Surg Hx  Fam Hx     Review of Systems:  No other skin or systemic complaints except as noted in HPI or Assessment and Plan.  Objective  Well appearing patient in no apparent distress; mood and affect are within normal limits.  A full examination was performed including scalp, head, eyes, ears, nose, lips, neck, chest, axillae, abdomen, back, buttocks, bilateral upper extremities, bilateral lower extremities, hands, feet, fingers, toes, fingernails, and toenails. All findings within normal limits unless otherwise noted below.  Objective  L temple x 3 (3): Erythematous thin papules/macules with gritty scale.   Assessment & Plan  AK (actinic keratosis) (3) L temple x 3  Destruction of lesion - L temple x 3 Complexity: simple   Destruction method: cryotherapy   Informed consent: discussed and consent obtained   Timeout:  patient name, date of birth, surgical site, and procedure verified Lesion destroyed using liquid nitrogen: Yes   Region frozen until ice ball extended beyond lesion: Yes   Outcome: patient tolerated procedure well with no complications   Post-procedure details: wound care instructions given    Skin cancer screening   Lentigines - Scattered tan macules - Discussed due to sun exposure - Benign, observe - Call for any changes  Seborrheic Keratoses - Stuck-on, waxy, tan-brown papules and plaques  - Discussed benign etiology and prognosis. - Observe - Call for any changes  Melanocytic Nevi - Tan-brown and/or pink-flesh-colored symmetric macules and papules - Benign  appearing on exam today - Observation - Call clinic for new or changing moles - Recommend daily use of broad spectrum spf 30+ sunscreen to sun-exposed areas.   Hemangiomas - Red papules - Discussed benign nature - Observe - Call for any changes  Actinic Damage - diffuse scaly erythematous macules with underlying dyspigmentation - Recommend daily broad spectrum sunscreen SPF 30+ to sun-exposed areas, reapply every 2 hours as needed.  - Call for new or changing lesions.  History of Melanoma - R post thigh, tumor thickness 0.16mm, Anatomic level III. Excised: 01/06/2018, margins free. - No evidence of recurrence today - No lymphadenopathy - Recommend regular full body skin exams - Recommend daily broad spectrum sunscreen SPF 30+ to sun-exposed areas, reapply every 2 hours as needed.  - Call if any new or changing lesions are noted between office visits  Acrochordons (Skin Tags) - Fleshy, skin-colored pedunculated papules - Benign appearing.  - Observe. - If desired, they can be removed with an in office procedure that is not covered by insurance. - Please call the clinic if you notice any new or changing lesions.  Skin cancer screening performed today.  Return in about 6 months (around 12/27/2020) for TBSE - Hx MM, AK's.  Luther Redo, CMA, am acting as scribe for Sarina Ser, MD .  Documentation: I have reviewed the above documentation for accuracy and completeness, and I agree with the above.  Sarina Ser, MD

## 2020-06-29 ENCOUNTER — Encounter: Payer: Self-pay | Admitting: Dermatology

## 2020-12-18 ENCOUNTER — Other Ambulatory Visit: Payer: Self-pay | Admitting: Internal Medicine

## 2020-12-18 DIAGNOSIS — Z1231 Encounter for screening mammogram for malignant neoplasm of breast: Secondary | ICD-10-CM

## 2020-12-27 ENCOUNTER — Ambulatory Visit (INDEPENDENT_AMBULATORY_CARE_PROVIDER_SITE_OTHER): Payer: Medicare Other | Admitting: Dermatology

## 2020-12-27 ENCOUNTER — Encounter: Payer: Self-pay | Admitting: Dermatology

## 2020-12-27 ENCOUNTER — Other Ambulatory Visit: Payer: Self-pay

## 2020-12-27 DIAGNOSIS — D1800 Hemangioma unspecified site: Secondary | ICD-10-CM

## 2020-12-27 DIAGNOSIS — Z1283 Encounter for screening for malignant neoplasm of skin: Secondary | ICD-10-CM | POA: Diagnosis not present

## 2020-12-27 DIAGNOSIS — L570 Actinic keratosis: Secondary | ICD-10-CM

## 2020-12-27 DIAGNOSIS — L578 Other skin changes due to chronic exposure to nonionizing radiation: Secondary | ICD-10-CM

## 2020-12-27 DIAGNOSIS — L814 Other melanin hyperpigmentation: Secondary | ICD-10-CM

## 2020-12-27 DIAGNOSIS — Z85820 Personal history of malignant melanoma of skin: Secondary | ICD-10-CM | POA: Diagnosis not present

## 2020-12-27 DIAGNOSIS — D692 Other nonthrombocytopenic purpura: Secondary | ICD-10-CM

## 2020-12-27 DIAGNOSIS — D229 Melanocytic nevi, unspecified: Secondary | ICD-10-CM

## 2020-12-27 DIAGNOSIS — L821 Other seborrheic keratosis: Secondary | ICD-10-CM

## 2020-12-27 DIAGNOSIS — L219 Seborrheic dermatitis, unspecified: Secondary | ICD-10-CM

## 2020-12-27 MED ORDER — MOMETASONE FUROATE 0.1 % EX SOLN: CUTANEOUS | 1 refills | Status: DC

## 2020-12-27 MED ORDER — KETOCONAZOLE 2 % EX SHAM
MEDICATED_SHAMPOO | CUTANEOUS | 6 refills | Status: DC
Start: 2020-12-27 — End: 2021-08-29

## 2020-12-27 NOTE — Patient Instructions (Signed)

## 2020-12-27 NOTE — Progress Notes (Signed)
Follow-Up Visit   Subjective  Mia Gray is a 65 y.o. female who presents for the following: Annual Exam (Hx MM, AK's ). The patient presents for Total-Body Skin Exam (TBSE) for skin cancer screening and mole check.  The following portions of the chart were reviewed this encounter and updated as appropriate:   Tobacco  Allergies  Meds  Problems  Med Hx  Surg Hx  Fam Hx     Review of Systems:  No other skin or systemic complaints except as noted in HPI or Assessment and Plan.  Objective  Well appearing patient in no apparent distress; mood and affect are within normal limits.  A full examination was performed including scalp, head, eyes, ears, nose, lips, neck, chest, axillae, abdomen, back, buttocks, bilateral upper extremities, bilateral lower extremities, hands, feet, fingers, toes, fingernails, and toenails. All findings within normal limits unless otherwise noted below.  Objective  Scalp: No scale or pinkness.  Objective  Face x (5): Erythematous thin papules/macules with gritty scale.   Assessment & Plan  Seborrheic dermatitis Scalp Seborrheic Dermatitis  -  is a chronic persistent rash characterized by pinkness and scaling most commonly of the mid face but also can occur on the scalp (dandruff), ears; mid chest and mid back. It tends to be exacerbated by stress and cooler weather.  People who have neurologic disease may experience new onset or exacerbation of existing seborrheic dermatitis.  The condition is not curable but treatable and can be controlled.  Start Ketoconazole 2% shampoo 3d/wk. Let sit 5-10 minutes before washing out.   Start Mometasone solution QHS x 2 weeks. Then decrease to PRN itch. Topical steroids (such as triamcinolone, fluocinolone, fluocinonide, mometasone, clobetasol, halobetasol, betamethasone, hydrocortisone) can cause thinning and lightening of the skin if they are used for too long in the same area. Your physician has selected the  right strength medicine for your problem and area affected on the body. Please use your medication only as directed by your physician to prevent side effects.    ketoconazole (NIZORAL) 2 % shampoo - Scalp  mometasone (ELOCON) 0.1 % lotion - Scalp  AK (actinic keratosis) (5) Face x 5  Destruction of lesion - Face x Complexity: simple   Destruction method: cryotherapy   Informed consent: discussed and consent obtained   Timeout:  patient name, date of birth, surgical site, and procedure verified Lesion destroyed using liquid nitrogen: Yes   Region frozen until ice ball extended beyond lesion: Yes   Outcome: patient tolerated procedure well with no complications   Post-procedure details: wound care instructions given     Lentigines - Scattered tan macules - Due to sun exposure - Benign-appering, observe - Recommend daily broad spectrum sunscreen SPF 30+ to sun-exposed areas, reapply every 2 hours as needed. - Call for any changes  Seborrheic Keratoses - Stuck-on, waxy, tan-brown papules and/or plaques  - Benign-appearing - Discussed benign etiology and prognosis. - Observe - Call for any changes  Melanocytic Nevi - Tan-brown and/or pink-flesh-colored symmetric macules and papules - Benign appearing on exam today - Observation - Call clinic for new or changing moles - Recommend daily use of broad spectrum spf 30+ sunscreen to sun-exposed areas.   Hemangiomas - Red papules - Discussed benign nature - Observe - Call for any changes  Actinic Damage - Chronic condition, secondary to cumulative UV/sun exposure - diffuse scaly erythematous macules with underlying dyspigmentation - Recommend daily broad spectrum sunscreen SPF 30+ to sun-exposed areas, reapply every 2 hours  as needed.  - Staying in the shade or wearing long sleeves, sun glasses (UVA+UVB protection) and wide brim hats (4-inch brim around the entire circumference of the hat) are also recommended for sun  protection.  - Call for new or changing lesions.  History of Melanoma - Right posterior thigh. MM, tumor thickness 0.39mm, Anatomic level III. Excised: 01/06/2018, margins free. - No evidence of recurrence today - No lymphadenopathy - Recommend regular full body skin exams - Recommend daily broad spectrum sunscreen SPF 30+ to sun-exposed areas, reapply every 2 hours as needed.  - Call if any new or changing lesions are noted between office visits  Purpura - Chronic; persistent and recurrent.  Treatable, but not curable. - Violaceous macules and patches - Benign - Related to trauma, age, sun damage and/or use of blood thinners, chronic use of topical and/or oral steroids - Observe - Can use OTC arnica containing moisturizer such as Dermend Bruise Formula if desired - Call for worsening or other concerns  Skin cancer screening performed today.  Return in about 4 months (around 04/28/2021) for check face and scalp.  Luther Redo, CMA, am acting as scribe for Sarina Ser, MD .  Documentation: I have reviewed the above documentation for accuracy and completeness, and I agree with the above.  Sarina Ser, MD

## 2021-01-03 ENCOUNTER — Other Ambulatory Visit: Payer: Self-pay | Admitting: Internal Medicine

## 2021-01-03 DIAGNOSIS — R1012 Left upper quadrant pain: Secondary | ICD-10-CM

## 2021-01-10 ENCOUNTER — Other Ambulatory Visit: Payer: Self-pay

## 2021-01-10 ENCOUNTER — Ambulatory Visit
Admission: RE | Admit: 2021-01-10 | Discharge: 2021-01-10 | Disposition: A | Discharge status: Home / Self Care | Payer: Medicare Other | Source: Ambulatory Visit | Attending: Internal Medicine | Admitting: Internal Medicine

## 2021-01-10 DIAGNOSIS — R1012 Left upper quadrant pain: Secondary | ICD-10-CM

## 2021-01-10 MED ORDER — IOHEXOL 300 MG/ML  SOLN
100.0000 mL | Freq: Once | INTRAMUSCULAR | Status: AC | PRN
  Administered 2021-01-10: 100 mL via INTRAVENOUS

## 2021-04-03 ENCOUNTER — Ambulatory Visit
Admission: RE | Admit: 2021-04-03 | Discharge: 2021-04-03 | Disposition: A | Discharge status: Home / Self Care | Payer: Medicare Other | Source: Ambulatory Visit | Attending: Internal Medicine | Admitting: Internal Medicine

## 2021-04-03 ENCOUNTER — Other Ambulatory Visit: Payer: Self-pay

## 2021-04-03 DIAGNOSIS — Z1231 Encounter for screening mammogram for malignant neoplasm of breast: Secondary | ICD-10-CM | POA: Insufficient documentation

## 2021-05-03 ENCOUNTER — Ambulatory Visit: Payer: Medicare Other | Admitting: Dermatology

## 2021-05-23 ENCOUNTER — Other Ambulatory Visit: Payer: Self-pay

## 2021-05-23 ENCOUNTER — Ambulatory Visit: Payer: Medicare Other | Admitting: Dermatology

## 2021-07-10 ENCOUNTER — Ambulatory Visit: Payer: Medicare Other | Admitting: Internal Medicine

## 2021-08-29 ENCOUNTER — Ambulatory Visit (INDEPENDENT_AMBULATORY_CARE_PROVIDER_SITE_OTHER): Payer: Medicare Other | Admitting: Dermatology

## 2021-08-29 ENCOUNTER — Other Ambulatory Visit: Payer: Self-pay

## 2021-08-29 DIAGNOSIS — L814 Other melanin hyperpigmentation: Secondary | ICD-10-CM

## 2021-08-29 DIAGNOSIS — L820 Inflamed seborrheic keratosis: Secondary | ICD-10-CM | POA: Diagnosis not present

## 2021-08-29 DIAGNOSIS — D1800 Hemangioma unspecified site: Secondary | ICD-10-CM | POA: Diagnosis not present

## 2021-08-29 DIAGNOSIS — L570 Actinic keratosis: Secondary | ICD-10-CM

## 2021-08-29 DIAGNOSIS — L219 Seborrheic dermatitis, unspecified: Secondary | ICD-10-CM | POA: Diagnosis not present

## 2021-08-29 DIAGNOSIS — L821 Other seborrheic keratosis: Secondary | ICD-10-CM

## 2021-08-29 DIAGNOSIS — L853 Xerosis cutis: Secondary | ICD-10-CM

## 2021-08-29 DIAGNOSIS — L578 Other skin changes due to chronic exposure to nonionizing radiation: Secondary | ICD-10-CM

## 2021-08-29 MED ORDER — KETOCONAZOLE 2 % EX SHAM: MEDICATED_SHAMPOO | CUTANEOUS | 6 refills | Status: AC

## 2021-08-29 MED ORDER — MOMETASONE FUROATE 0.1 % EX SOLN: CUTANEOUS | 2 refills | Status: AC

## 2021-08-29 NOTE — Patient Instructions (Signed)

## 2021-08-29 NOTE — Progress Notes (Signed)
Follow-Up Visit   Subjective  Mia Gray is a 65 y.o. female who presents for the following: Actinic Keratosis (Face, 52m f/u) and Seborrheic Dermatitis (Scalp, pt d/c Ketoconazole 2% shampoo, and Mometasone sol). The patient has spots, moles and lesions to be evaluated, some may be new or changing and the patient has concerns that these could be cancer.  The following portions of the chart were reviewed this encounter and updated as appropriate:   Tobacco  Allergies  Meds  Problems  Med Hx  Surg Hx  Fam Hx     Review of Systems:  No other skin or systemic complaints except as noted in HPI or Assessment and Plan.  Objective  Well appearing patient in no apparent distress; mood and affect are within normal limits.  A focused examination was performed including face, scalp, hands. Relevant physical exam findings are noted in the Assessment and Plan.  Scalp Scalp mostly clear  L zygoma x 1 Pink scaly macules   R infraorbital x 1, L dorsum hand x 1, R dorsum hand x 1, Total = 3 (3) Erythematous keratotic or waxy stuck-on papule or plaque.    Assessment & Plan   Lentigines - Scattered tan macules - Due to sun exposure - Benign-appering, observe - Recommend daily broad spectrum sunscreen SPF 30+ to sun-exposed areas, reapply every 2 hours as needed. - Call for any changes  Seborrheic Keratoses - Stuck-on, waxy, tan-brown papules and/or plaques  - Benign-appearing - Discussed benign etiology and prognosis. - Observe - Call for any changes  Hemangiomas - Red papules - Discussed benign nature - Observe - Call for any changes   Actinic Damage - chronic, secondary to cumulative UV radiation exposure/sun exposure over time - diffuse scaly erythematous macules with underlying dyspigmentation - Recommend daily broad spectrum sunscreen SPF 30+ to sun-exposed areas, reapply every 2 hours as needed.  - Recommend staying in the shade or wearing long sleeves, sun  glasses (UVA+UVB protection) and wide brim hats (4-inch brim around the entire circumference of the hat). - Call for new or changing lesions.   Xerosis - diffuse xerotic patches - recommend gentle, hydrating skin care - gentle skin care handout given - recommend Cerave cream  Seborrheic dermatitis Scalp  Seborrheic Dermatitis  -  is a chronic persistent rash characterized by pinkness and scaling most commonly of the mid face but also can occur on the scalp (dandruff), ears; mid chest, mid back and groin.  It tends to be exacerbated by stress and cooler weather.  People who have neurologic disease may experience new onset or exacerbation of existing seborrheic dermatitis.  The condition is not curable but treatable and can be controlled.  Currently controlled  Cont Ketoconazole 2% shampoo 3x/wk prn flares Cont Mometasone sol qd up to 5d/wk prn itching  Related Medications ketoconazole (NIZORAL) 2 % shampoo Shampoo into scalp let sit 5-10 minutes then wash out. Use 3 days per week.  mometasone (ELOCON) 0.1 % lotion Apply to itchy areas of scalp qd up to 5 days per week prn itching  AK (actinic keratosis) L zygoma x 1  Destruction of lesion - L zygoma x 1 Complexity: simple   Destruction method: cryotherapy   Informed consent: discussed and consent obtained   Timeout:  patient name, date of birth, surgical site, and procedure verified Lesion destroyed using liquid nitrogen: Yes   Region frozen until ice ball extended beyond lesion: Yes   Outcome: patient tolerated procedure well with no complications  Post-procedure details: wound care instructions given    Inflamed seborrheic keratosis R infraorbital x 1, L dorsum hand x 1, R dorsum hand x 1, Total = 3  Destruction of lesion - R infraorbital x 1, L dorsum hand x 1, R dorsum hand x 1, Total = 3 Complexity: simple   Destruction method: cryotherapy   Informed consent: discussed and consent obtained   Timeout:  patient name,  date of birth, surgical site, and procedure verified Lesion destroyed using liquid nitrogen: Yes   Region frozen until ice ball extended beyond lesion: Yes   Outcome: patient tolerated procedure well with no complications   Post-procedure details: wound care instructions given    Return in about 1 year (around 08/29/2022) for TBSE.  I, Othelia Pulling, RMA, am acting as scribe for Sarina Ser, MD . Documentation: I have reviewed the above documentation for accuracy and completeness, and I agree with the above.  Sarina Ser, MD

## 2021-09-07 ENCOUNTER — Encounter: Payer: Self-pay | Admitting: Dermatology

## 2021-11-15 IMAGING — CT CT ABD-PELV W/ CM
1 of 3 series · 14 of 32 positions shown, 19 images · IV contrast (APPLIED)
Comparison: CT abdomen pelvis dated 05/26/2015.

CLINICAL DATA: 65-year-old female with history of melanoma and
left-sided abdominal pain.

EXAM:
CT ABDOMEN AND PELVIS WITH CONTRAST
TECHNIQUE: Multidetector CT imaging of the abdomen and pelvis was performed
using the standard protocol following bolus administration of
intravenous contrast.
CONTRAST:  100mL OMNIPAQUE IOHEXOL 300 MG/ML  SOLN

[Series 2: axial st · axial · 0.78mm/px · z∈[-841,-426]mm · 14 of 95 slices shown, 19 images]
[im 6/95  soft-tissue]
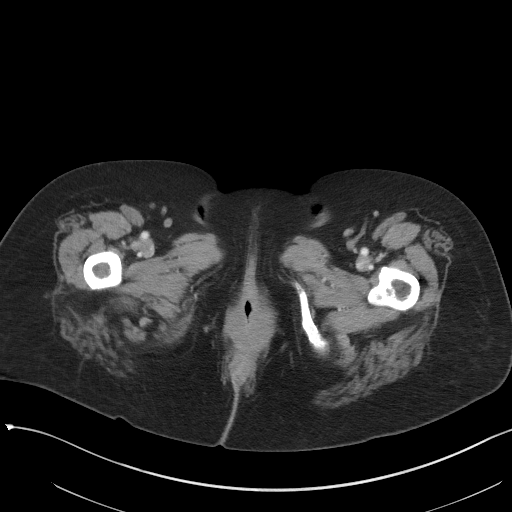
[im 6/95  bone]
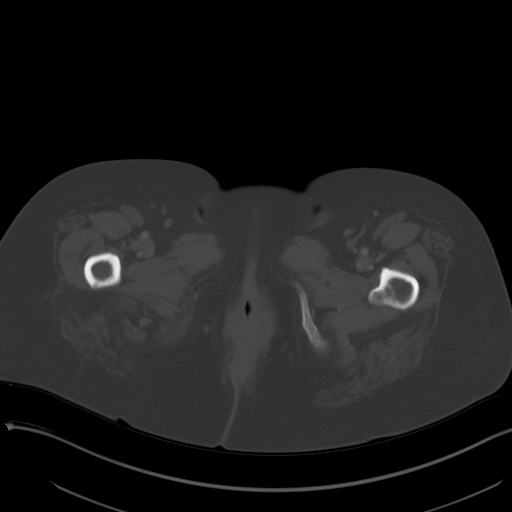
[im 12/95  soft-tissue]
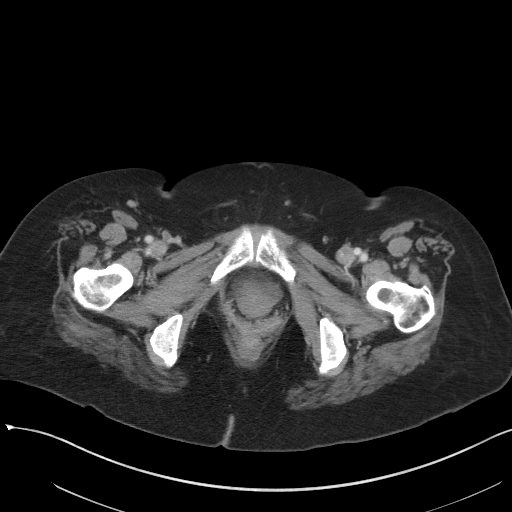
[im 23/95  soft-tissue]
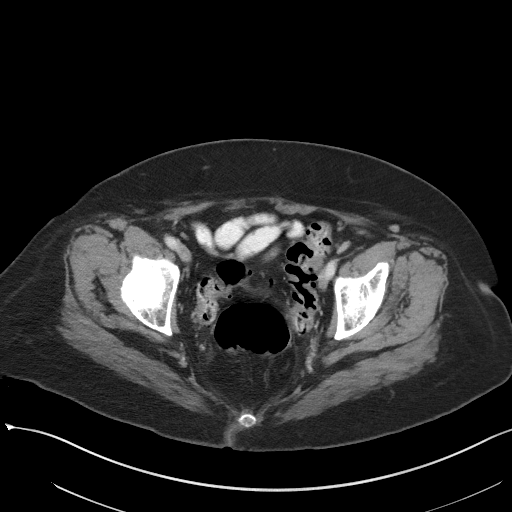
[im 28/95  soft-tissue]
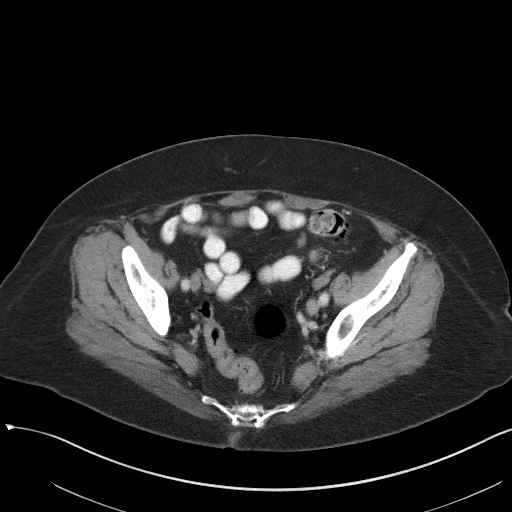
[im 34/95  soft-tissue]
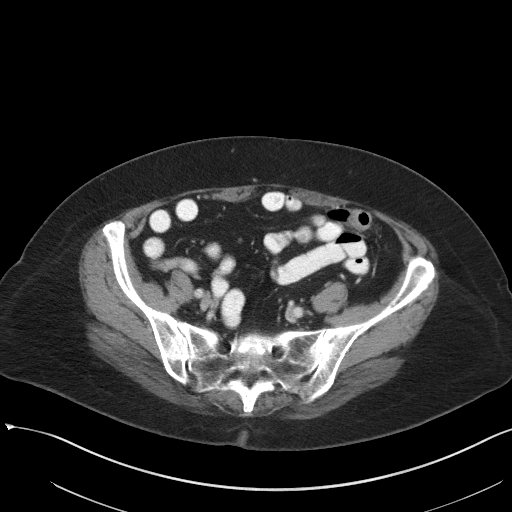
[im 39/95  soft-tissue]
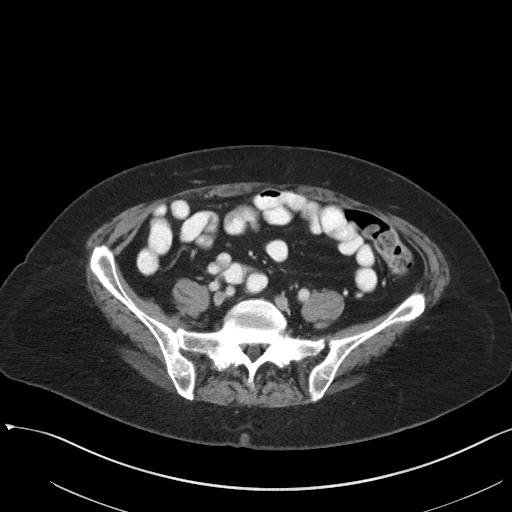
[im 50/95  soft-tissue]
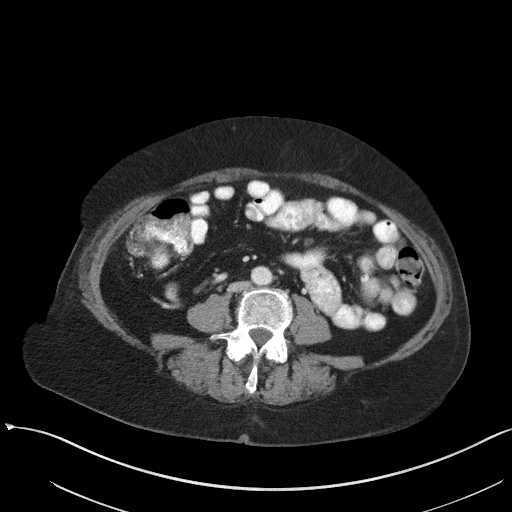
[im 56/95  soft-tissue]
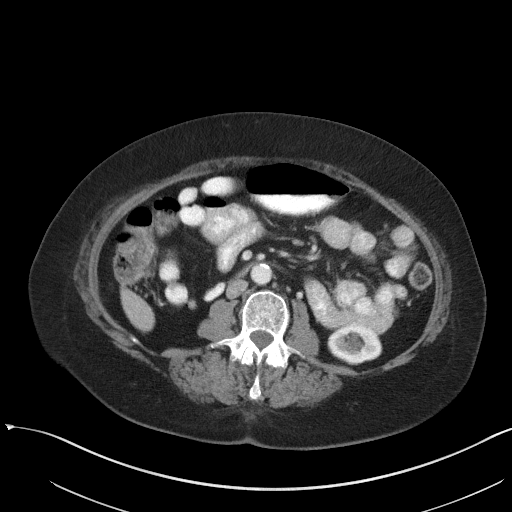
[im 61/95  soft-tissue]
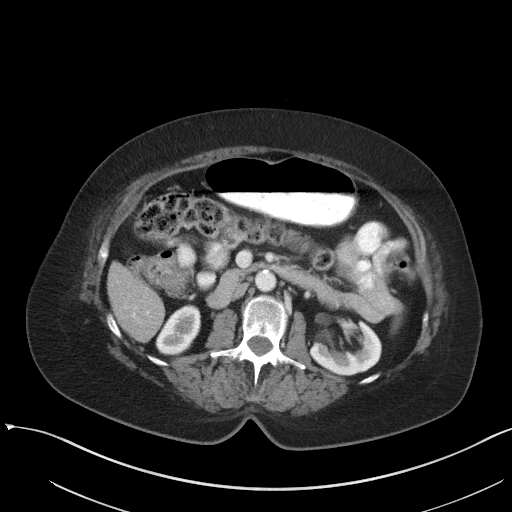
[im 61/95  bone]
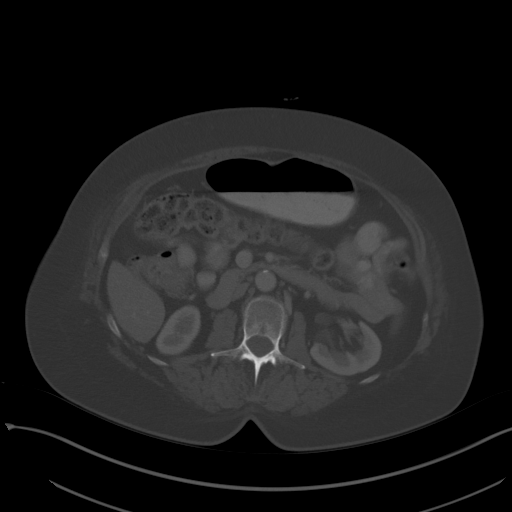
[im 67/95  soft-tissue]
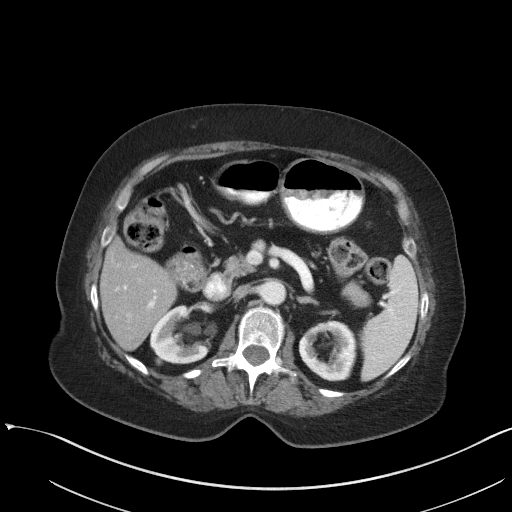
[im 72/95  soft-tissue]
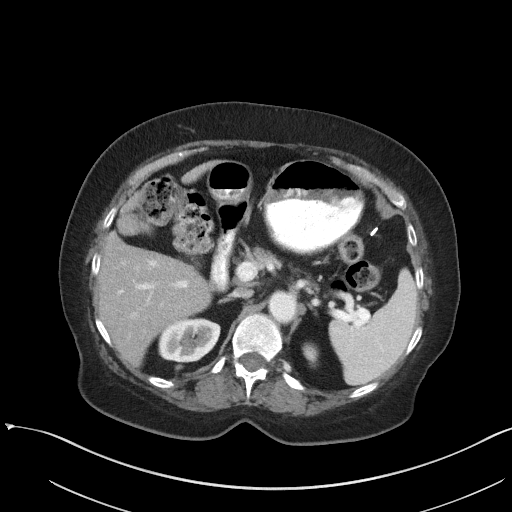
[im 72/95  lung]
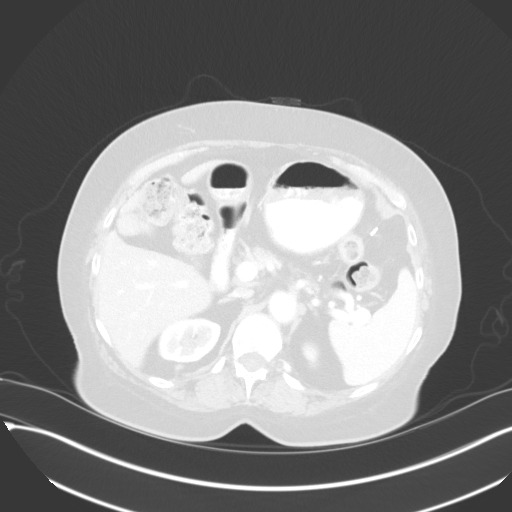
[im 78/95  lung]
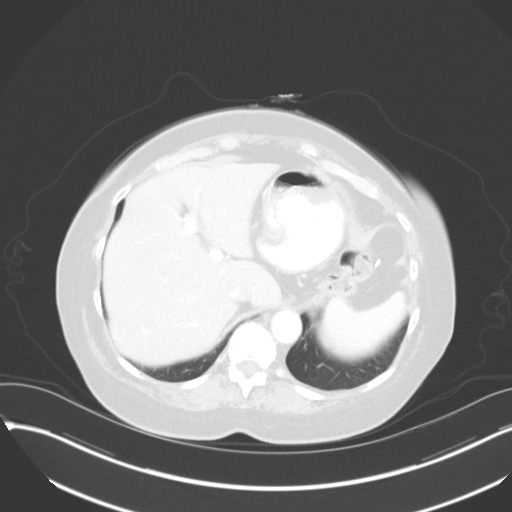
[im 83/95  soft-tissue]
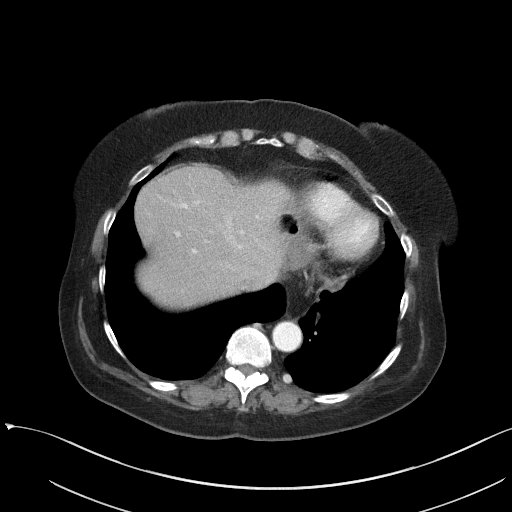
[im 83/95  lung]
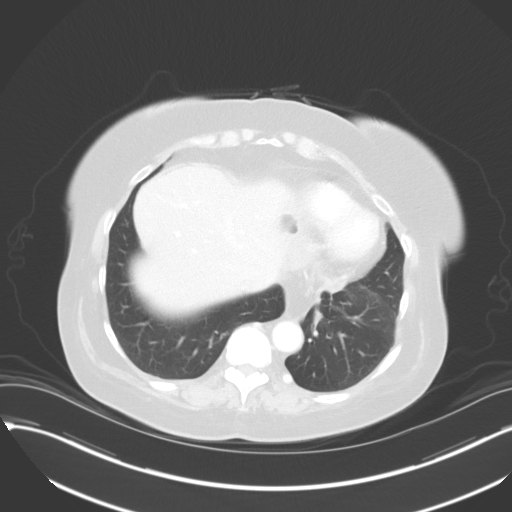
[im 89/95  soft-tissue]
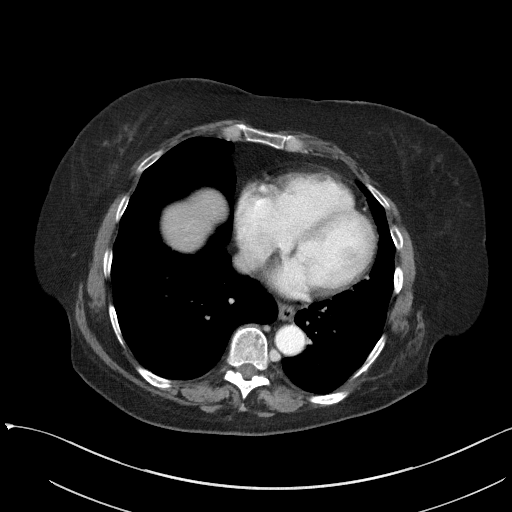
[im 89/95  lung]
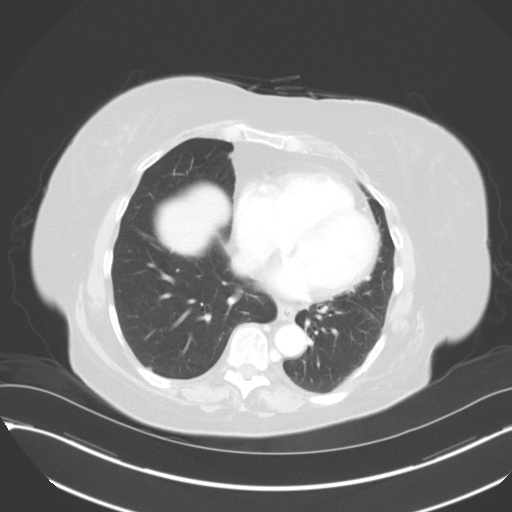

[14 of 32 positions shown; findings below may reference images not displayed]

FINDINGS: Lower chest: The visualized lung bases are clear.

No intra-abdominal free air or free fluid.

Hepatobiliary: The liver is unremarkable. No intrahepatic biliary
ductal dilatation. Cholecystectomy. No retained calcified stone
noted in the central CBD.

Pancreas: Unremarkable. No pancreatic ductal dilatation or
surrounding inflammatory changes.

Spleen: Normal in size without focal abnormality.

Adrenals/Urinary Tract: The adrenal glands unremarkable. Mild
bilateral renal parenchyma atrophy. There is no hydronephrosis on
either side. There is symmetric enhancement and excretion of
contrast by both kidneys. Small bilateral parapelvic cysts. The
visualized ureters and urinary bladder appear unremarkable.

Stomach/Bowel: There is severe sigmoid diverticulosis without active
inflammatory changes. There is moderate stool throughout the colon.
There is no bowel obstruction or active inflammation. Probable
postsurgical changes of Nissen fundoplication. The appendix is
normal.

Vascular/Lymphatic: Mild aortoiliac atherosclerotic disease. The IVC
is unremarkable. No portal venous gas. There is no adenopathy.

Reproductive: The uterus is anteverted and grossly unremarkable. No
adnexal masses.

Other: None

Musculoskeletal: Mild degenerative changes. No acute osseous
pathology.
IMPRESSION: 1. No acute intra-abdominal or pelvic pathology.
2. Severe sigmoid diverticulosis. No bowel obstruction. Normal
appendix.
3. Aortic Atherosclerosis ([id number]).

## 2022-02-01 ENCOUNTER — Other Ambulatory Visit: Payer: Self-pay | Admitting: Neurology

## 2022-02-01 DIAGNOSIS — G43009 Migraine without aura, not intractable, without status migrainosus: Secondary | ICD-10-CM

## 2022-02-11 ENCOUNTER — Ambulatory Visit
Admission: RE | Admit: 2022-02-11 | Discharge: 2022-02-11 | Disposition: A | Discharge status: Home / Self Care | Payer: Medicare Other | Source: Ambulatory Visit | Attending: Neurology | Admitting: Neurology

## 2022-02-11 DIAGNOSIS — G43009 Migraine without aura, not intractable, without status migrainosus: Secondary | ICD-10-CM

## 2022-02-25 ENCOUNTER — Other Ambulatory Visit: Payer: Self-pay | Admitting: Internal Medicine

## 2022-02-25 DIAGNOSIS — Z1231 Encounter for screening mammogram for malignant neoplasm of breast: Secondary | ICD-10-CM

## 2022-04-04 ENCOUNTER — Ambulatory Visit
Admission: RE | Admit: 2022-04-04 | Discharge: 2022-04-04 | Disposition: A | Discharge status: Home / Self Care | Payer: Medicare Other | Source: Ambulatory Visit | Attending: Internal Medicine | Admitting: Internal Medicine

## 2022-04-04 DIAGNOSIS — Z1231 Encounter for screening mammogram for malignant neoplasm of breast: Secondary | ICD-10-CM | POA: Insufficient documentation

## 2022-05-28 ENCOUNTER — Encounter: Payer: Self-pay | Admitting: Internal Medicine

## 2022-05-29 ENCOUNTER — Encounter
Admission: RE | Disposition: A | Discharge status: Home / Self Care | Payer: Self-pay | Source: Home / Self Care | Attending: Internal Medicine

## 2022-05-29 ENCOUNTER — Ambulatory Visit
Admission: RE | Admit: 2022-05-29 | Discharge: 2022-05-29 | Disposition: A | Discharge status: Home / Self Care | Payer: Medicare Other | Attending: Internal Medicine | Admitting: Internal Medicine

## 2022-05-29 ENCOUNTER — Encounter: Payer: Self-pay | Admitting: Internal Medicine

## 2022-05-29 ENCOUNTER — Ambulatory Visit: Payer: Medicare Other | Admitting: Anesthesiology

## 2022-05-29 DIAGNOSIS — Q438 Other specified congenital malformations of intestine: Secondary | ICD-10-CM | POA: Diagnosis not present

## 2022-05-29 DIAGNOSIS — R519 Headache, unspecified: Secondary | ICD-10-CM | POA: Diagnosis not present

## 2022-05-29 DIAGNOSIS — K5730 Diverticulosis of large intestine without perforation or abscess without bleeding: Secondary | ICD-10-CM | POA: Diagnosis not present

## 2022-05-29 DIAGNOSIS — Z86010 Personal history of colonic polyps: Secondary | ICD-10-CM | POA: Insufficient documentation

## 2022-05-29 DIAGNOSIS — Z1211 Encounter for screening for malignant neoplasm of colon: Secondary | ICD-10-CM | POA: Insufficient documentation

## 2022-05-29 DIAGNOSIS — K449 Diaphragmatic hernia without obstruction or gangrene: Secondary | ICD-10-CM | POA: Insufficient documentation

## 2022-05-29 DIAGNOSIS — K640 First degree hemorrhoids: Secondary | ICD-10-CM | POA: Insufficient documentation

## 2022-05-29 DIAGNOSIS — Z9889 Other specified postprocedural states: Secondary | ICD-10-CM | POA: Insufficient documentation

## 2022-05-29 DIAGNOSIS — I10 Essential (primary) hypertension: Secondary | ICD-10-CM | POA: Diagnosis not present

## 2022-05-29 HISTORY — PX: COLONOSCOPY WITH PROPOFOL: SHX5780

## 2022-05-29 SURGERY — COLONOSCOPY WITH PROPOFOL
Anesthesia: General

## 2022-05-29 MED ORDER — PROPOFOL 10 MG/ML IV BOLUS
INTRAVENOUS | Status: DC | PRN
  Administered 2022-05-29: 70 mg via INTRAVENOUS

## 2022-05-29 MED ORDER — PROPOFOL 500 MG/50ML IV EMUL
INTRAVENOUS | Status: DC | PRN
  Administered 2022-05-29: 150 ug/kg/min via INTRAVENOUS

## 2022-05-29 MED ORDER — LIDOCAINE 2% (20 MG/ML) 5 ML SYRINGE
INTRAMUSCULAR | Status: DC | PRN
  Administered 2022-05-29: 50 mg via INTRAVENOUS

## 2022-05-29 MED ORDER — SODIUM CHLORIDE 0.9 % IV SOLN: INTRAVENOUS | Status: DC

## 2022-05-29 MED ORDER — PROPOFOL 1000 MG/100ML IV EMUL
INTRAVENOUS | Status: AC
  Filled 2022-05-29: qty 100

## 2022-05-29 NOTE — Anesthesia Preprocedure Evaluation (Signed)
Anesthesia Evaluation  Patient identified by MRN, date of birth, ID band Patient awake    Reviewed: Allergy & Precautions, NPO status , Patient's Chart, lab work & pertinent test results  History of Anesthesia Complications Negative for: history of anesthetic complications  Airway Mallampati: III  TM Distance: <3 FB Neck ROM: full    Dental  (+) Chipped, Poor Dentition, Missing   Pulmonary neg pulmonary ROS, neg shortness of breath,    Pulmonary exam normal        Cardiovascular Exercise Tolerance: Good hypertension, (-) angina(-) Past MI and (-) DOE negative cardio ROS Normal cardiovascular exam     Neuro/Psych  Headaches,  Neuromuscular disease negative psych ROS   GI/Hepatic negative GI ROS, Neg liver ROS, hiatal hernia,   Endo/Other  negative endocrine ROS  Renal/GU negative Renal ROS  negative genitourinary   Musculoskeletal   Abdominal   Peds  Hematology negative hematology ROS (+)   Anesthesia Other Findings Past Medical History: No date: Actinic keratosis No date: Bronchitis No date: Hiatal hernia No date: Hypertension 10/21/2017: Melanoma ([place])     Comment:  Right posterior thigh. MM, tumor thickness 0.11m,               Anatomic level III. Excised: 01/06/2018, margins free. No date: Migraine  Past Surgical History: 10/15/2016: BREAST BIOPSY; Right     Comment:  CALCIFICATIONS WITHIN SCATTERED MICROCYSTS AND INVOLUTED              LOBULES. 10+ yrs ago: BREAST CYST ASPIRATION; Right     Comment:  negative No date: BREAST SURGERY; Right     Comment:  lumpectomy No date: CHOLECYSTECTOMY     [place] PROPOF[place]; N/A     Comment:  Procedure: COLONOSCOPY WITH PROPOFOL;  Surgeon:               SLollie Sails MD;  Location: [facility] ENDOSCOPY;                Service: Endoscopy;  Laterality: N/A; 07/15/2017: ESOPHAGOGASTRODUODENOSCOPY (EGD) WITH PROPOFOL; N/A      Comment:  Procedure: ESOPHAGOGASTRODUODENOSCOPY (EGD) WITH               PROPOFOL;  Surgeon: SLollie Sails MD;  [facility];  Service: Endoscopy;  Laterality: N/A; No date: haital hernia No date: HERNIA REPAIR; Right     Comment:  x 2 No date: HERNIA REPAIR; Left     Comment:  [place]  BMI    Body Mass Index: 26.89 kg/m      Reproductive/Obstetrics negative OB ROS                             Anesthesia Physical Anesthesia Plan  ASA: 3  Anesthesia Plan: General   Post-op Pain Management:    Induction: Intravenous  PONV Risk Score and Plan: Propofol infusion and TIVA  Airway Management Planned: Natural Airway and Nasal Cannula  Additional Equipment:   Intra-op Plan:   Post-operative Plan:   Informed Consent: I have reviewed the patients History and Physical, chart, labs and discussed the procedure including the risks, benefits and alternatives for the proposed anesthesia with the patient or authorized representative who has indicated his/her understanding and acceptance.     Dental Advisory Given  Plan Discussed with: Anesthesiologist, CRNA and Surgeon  Anesthesia Plan  Comments: (Patient consented for risks of anesthesia including but not limited to:  - adverse reactions to medications - risk of airway placement if required - damage to eyes, teeth, lips or other oral mucosa - nerve damage due to positioning  - sore throat or hoarseness - Damage to heart, brain, nerves, lungs, other parts of body or loss of life  Patient voiced understanding.)        Anesthesia Quick Evaluation

## 2022-05-29 NOTE — Anesthesia Procedure Notes (Signed)
Date/Time: 05/29/2022 4:03 PM  Performed by: Johnna Acosta, CRNAPre-anesthesia Checklist: Patient identified, Emergency Drugs available, Suction available, Patient being monitored and Timeout performed Patient Re-evaluated:Patient Re-evaluated prior to induction Oxygen Delivery Method: Nasal cannula Preoxygenation: Pre-oxygenation with 100% oxygen Induction Type: IV induction

## 2022-05-29 NOTE — Op Note (Signed)
[facility] Patient Name: Mia Gray Procedure Date: 05/29/2022 3:50 PM MRN: [id number] Account #: [id number] Date of Birth: [birth year] Admit Type: Outpatient Age: 66 Room: [facility] ENDO ROOM 2 Gender: Female Note Status: Finalized Instrument Name: Jasper Riling [id number] Procedure:             Colonoscopy Indications:           High risk colon cancer surveillance: Personal history                         of non-advanced adenoma Providers:             Benay Pike. Liesel Peckenpaugh MD, MD Medicines:             Propofol per Anesthesia Complications:         No immediate complications. Procedure:             Pre-Anesthesia Assessment:                        - The risks and benefits of the procedure and the                         sedation options and risks were discussed with the                         patient. All questions were answered and informed                         consent was obtained.                        - Patient identification and proposed procedure were                         verified prior to the procedure by the nurse. The                         procedure was verified in the procedure room.                        - ASA Grade Assessment: III - A patient with severe                         systemic disease.                        - After reviewing the risks and benefits, the patient                         was deemed in satisfactory condition to undergo the                         procedure.                        After obtaining informed consent, the colonoscope was                         passed under direct vision. Throughout the procedure,  the patient's blood pressure, pulse, and oxygen                         saturations were monitored continuously. The                         Colonoscope was introduced through the anus and                         advanced to the the cecum, identified by appendiceal                          orifice and ileocecal valve. The colonoscopy was                         somewhat difficult due to significant looping.                         Successful completion of the procedure was aided by                         applying abdominal pressure. The patient tolerated the                         procedure well. The quality of the bowel preparation                         was adequate. The ileocecal valve, appendiceal                         orifice, and rectum were photographed. Findings:      The perianal and digital rectal examinations were normal. Pertinent       negatives include normal sphincter tone and no palpable rectal lesions.      Internal hemorrhoids were found during retroflexion. The hemorrhoids       were Grade I (internal hemorrhoids that do not prolapse).      Many small and large-mouthed diverticula were found in the sigmoid       colon. There was no evidence of diverticular bleeding.      The exam was otherwise without abnormality. Impression:            - Internal hemorrhoids.                        - Moderate diverticulosis in the sigmoid colon. There                         was no evidence of diverticular bleeding.                        - The examination was otherwise normal.                        - No specimens collected. Recommendation:        - Patient has a contact number available for                         emergencies. The signs and symptoms of potential  delayed complications were discussed with the patient.                         Return to normal activities tomorrow. Written                         discharge instructions were provided to the patient.                        - Resume previous diet.                        - Continue present medications.                        - Repeat colonoscopy in 10 years for screening                         purposes.                        - Return to GI office PRN.                         - The findings and recommendations were discussed with                         the patient. Procedure Code(s):     --- Professional ---                        Y8657, Colorectal cancer screening; colonoscopy on                         individual at high risk Diagnosis Code(s):     --- Professional ---                        K57.30, Diverticulosis of large intestine without                         perforation or abscess without bleeding                        K64.0, First degree hemorrhoids                        Z86.010, Personal history of colonic polyps CPT copyright 2019 [employer]. All rights reserved. The codes documented in this report are preliminary and upon coder review may  be revised to meet current compliance requirements. Efrain Sella MD, MD 05/29/2022 4:28:11 PM This report has been signed electronically. Number of Addenda: 0 Note Initiated On: 05/29/2022 3:50 PM Scope Withdrawal Time: 0 hours 7 minutes 35 seconds  Total Procedure Duration: 0 hours 17 minutes 3 seconds  Estimated Blood Loss:  Estimated blood loss: none.      [facility]

## 2022-05-29 NOTE — Interval H&P Note (Signed)
History and Physical Interval Note:  05/29/2022 3:53 PM  Mia Gray  has presented today for surgery, with the diagnosis of Z86.010 - Personal history of colonic polyps.  The various methods of treatment have been discussed with the patient and family. After consideration of risks, benefits and other options for treatment, the patient has consented to  Procedure(s): COLONOSCOPY WITH PROPOFOL (N/A) as a surgical intervention.  The patient's history has been reviewed, patient examined, no change in status, stable for surgery.  I have reviewed the patient's chart and labs.  Questions were answered to the patient's satisfaction.     [place], [place]

## 2022-05-29 NOTE — Transfer of Care (Signed)
Immediate Anesthesia Transfer of Care Note  Patient: Mia Gray  Procedure(s) Performed: COLONOSCOPY WITH PROPOFOL  Patient Location: PACU  Anesthesia Type:General  Level of Consciousness: awake and drowsy  Airway & Oxygen Therapy: Patient Spontanous Breathing and Patient connected to nasal cannula oxygen  Post-op Assessment: Report given to RN and Post -op Vital signs reviewed and stable  Post vital signs: Reviewed and stable  Last Vitals:  Vitals Value Taken Time  BP 100/69 05/29/22 1630  Temp    Pulse 81 05/29/22 1630  Resp 14 05/29/22 1630  SpO2 95 % 05/29/22 1630    Last Pain:  Vitals:   05/29/22 1506  TempSrc: Temporal  PainSc: 0-No pain         Complications: No notable events documented.

## 2022-05-29 NOTE — Interval H&P Note (Signed)
History and Physical Interval Note:  05/29/2022 3:53 PM  Mia Gray  has presented today for surgery, with the diagnosis of Z86.010 - Personal history of colonic polyps.  The various methods of treatment have been discussed with the patient and family. After consideration of risks, benefits and other options for treatment, the patient has consented to  Procedure(s): COLONOSCOPY WITH PROPOFOL (N/A) as a surgical intervention.  The patient's history has been reviewed, patient examined, no change in status, stable for surgery.  I have reviewed the patient's chart and labs.  Questions were answered to the patient's satisfaction.     [place], [place]

## 2022-05-29 NOTE — Anesthesia Postprocedure Evaluation (Signed)
Anesthesia Post Note  Patient: Mia Gray  Procedure(s) Performed: COLONOSCOPY WITH PROPOFOL  Patient location during evaluation: Endoscopy Anesthesia Type: General Level of consciousness: awake and alert Pain management: pain level controlled Vital Signs Assessment: post-procedure vital signs reviewed and stable Respiratory status: spontaneous breathing, nonlabored ventilation, respiratory function stable and patient connected to nasal cannula oxygen Cardiovascular status: blood pressure returned to baseline and stable Postop Assessment: no apparent nausea or vomiting Anesthetic complications: no   No notable events documented.   Last Vitals:  Vitals:   05/29/22 1630 05/29/22 1640  BP: 100/69 113/63  Pulse: 81 (!) 2  Resp: 14   Temp:    SpO2: 95% 96%    Last Pain:  Vitals:   05/29/22 1640  TempSrc:   PainSc: 0-No pain                 Precious Haws Quiana Cobaugh

## 2022-05-29 NOTE — H&P (Signed)
Outpatient short stay form Pre-procedure 05/29/2022 3:51 PM Mia Gray K. Mia Gray, M.D.  Primary Physician: Mia Gray, M.D.  Reason for visit:  Personal history of adenomatous colon polyps  History of present illness:  07/15/17: EGD (indic: LLQ pain) - Irregular Z-line, path squamocolumnar mucosa with mild chronic inflammation. Normal esophagus. Gastritis in stomach and cardia. S/p Nissen fundoplication w/ healthy-appearing mucosa.                    : CSY (indic: rectal bleeding, constipation) - 5 mm TA in distal transverse colon. 2 mm hyperplastic polyp in distal transverse colon. Sigmoid, descending, and transverse diverticulosis. Redundant colon. Internal and external hemorrhoids. Repeat in 06/2022. -- 08/11/17: Barium enema - Left diaphragmatic hernia containing the splenic flexure of the colon. There is slight narrowing of the lumen as the colon enters and exits the hernia. -- 2019: Repair of paraesophageal hernia with mesh.    Current Facility-Administered Medications:    0.9 %  sodium chloride infusion, , Intravenous, Continuous, Mia Gray, Mia Pike, MD, Last Rate: 20 mL/hr at 05/29/22 1521, Restarted at 05/29/22 1539  Medications Prior to Admission  Medication Sig Dispense Refill Last Dose   metoprolol tartrate (LOPRESSOR) 25 MG tablet Take 12.5 mg by mouth 2 (two) times daily.   05/29/2022 at 0900   SUMAtriptan (IMITREX) 50 MG tablet Take 50 mg by mouth every 2 (two) hours as needed for migraine. May repeat in 2 hours if headache persists or recurs.   05/29/2022 at 0900   acetaminophen (TYLENOL) 325 MG tablet Take 2 tablets (650 mg total) by mouth 2 (two) times daily as needed for moderate pain or fever. 20 tablet 0    albuterol (PROVENTIL HFA;VENTOLIN HFA) 108 (90 BASE) MCG/ACT inhaler Inhale 1 puff into the lungs every 6 (six) hours as needed for wheezing or shortness of breath.      amitriptyline (ELAVIL) 25 MG tablet Take 50 mg by mouth at bedtime.       brompheniramine-pseudoephedrine-DM 30-2-10 MG/5ML syrup Take 5 mLs by mouth 4 (four) times daily as needed. 120 mL 0    cetirizine (ZYRTEC) 10 MG tablet Take 10 mg by mouth daily as needed for allergies.      CREON 24000-76000 units CPEP Take 1 capsule by mouth 3 (three) times daily.      docusate sodium (COLACE) 100 MG capsule Take 100 mg by mouth 2 (two) times daily as needed for mild constipation.      feeding supplement, ENSURE ENLIVE, (ENSURE ENLIVE) LIQD Take 237 mLs by mouth 3 (three) times daily. 237 mL 12    ipratropium (ATROVENT HFA) 17 MCG/ACT inhaler Inhale 2 puffs into the lungs every 6 (six) hours as needed for wheezing.      ketoconazole (NIZORAL) 2 % shampoo Shampoo into scalp let sit 5-10 minutes then wash out. Use 3 days per week. 120 mL 6    mometasone (ELOCON) 0.1 % lotion Apply to itchy areas of scalp qd up to 5 days per week prn itching 60 mL 2    MOTEGRITY 1 MG TABS Take 1 tablet by mouth at bedtime.      ondansetron (ZOFRAN-ODT) 4 MG disintegrating tablet Take 4 mg by mouth every 8 (eight) hours as needed for nausea or vomiting.      pantoprazole (PROTONIX) 40 MG tablet Take 1 tablet (40 mg total) by mouth 2 (two) times daily. 60 tablet 0    penicillin v potassium (VEETID) 500 MG tablet Take 1 tablet (500 mg  total) by mouth 4 (four) times daily. 40 tablet 0    sodium chloride (OCEAN) 0.65 % SOLN nasal spray Place 1 spray into both nostrils as needed for congestion.        Allergies  Allergen Reactions   Hydrocodone Nausea And Vomiting     Past Medical History:  Diagnosis Date   Actinic keratosis    Bronchitis    Hiatal hernia    Hypertension    Melanoma ([place]) 10/21/2017   Right posterior thigh. MM, tumor thickness 0.73m, Anatomic level III. Excised: 01/06/2018, margins free.   Migraine     Review of systems:  Otherwise negative.    Physical Exam  Gen: Alert, oriented. Appears stated age.  HEENT: [place]/AT. PERRLA. Lungs: CTA, no wheezes. CV: RR nl S1,  S2. Abd: soft, benign, no masses. BS+ Ext: No edema. Pulses 2+    Planned procedures: Proceed with colonoscopy. The patient understands the nature of the planned procedure, indications, risks, alternatives and potential complications including but not limited to bleeding, infection, perforation, damage to internal organs and possible oversedation/side effects from anesthesia. The patient agrees and gives consent to proceed.  Please refer to procedure notes for findings, recommendations and patient disposition/instructions.     Mia Gray K. TAlice Gray M.D. Gastroenterology 05/29/2022  3:51 PM

## 2022-05-30 ENCOUNTER — Encounter: Payer: Self-pay | Admitting: Internal Medicine

## 2022-09-04 ENCOUNTER — Ambulatory Visit (INDEPENDENT_AMBULATORY_CARE_PROVIDER_SITE_OTHER): Payer: Medicare Other | Admitting: Dermatology

## 2022-09-04 DIAGNOSIS — Z85820 Personal history of malignant melanoma of skin: Secondary | ICD-10-CM

## 2022-09-04 DIAGNOSIS — D229 Melanocytic nevi, unspecified: Secondary | ICD-10-CM

## 2022-09-04 DIAGNOSIS — L578 Other skin changes due to chronic exposure to nonionizing radiation: Secondary | ICD-10-CM | POA: Diagnosis not present

## 2022-09-04 DIAGNOSIS — L820 Inflamed seborrheic keratosis: Secondary | ICD-10-CM

## 2022-09-04 DIAGNOSIS — L570 Actinic keratosis: Secondary | ICD-10-CM | POA: Diagnosis not present

## 2022-09-04 DIAGNOSIS — Z1283 Encounter for screening for malignant neoplasm of skin: Secondary | ICD-10-CM

## 2022-09-04 DIAGNOSIS — L814 Other melanin hyperpigmentation: Secondary | ICD-10-CM | POA: Diagnosis not present

## 2022-09-04 DIAGNOSIS — L821 Other seborrheic keratosis: Secondary | ICD-10-CM

## 2022-09-04 NOTE — Progress Notes (Signed)
Follow-Up Visit   Subjective  Mia Gray is a 66 y.o. female who presents for the following: Annual Exam (1 year tbse, hx of of seb derm hx of aks, hx of melanoma. Has a few places on face and nose she would like checked. ). The patient presents for Total-Body Skin Exam (TBSE) for skin cancer screening and mole check.  The patient has spots, moles and lesions to be evaluated, some may be new or changing and the patient has concerns that these could be cancer.  The following portions of the chart were reviewed this encounter and updated as appropriate:  Tobacco  Allergies  Meds  Problems  Med Hx  Surg Hx  Fam Hx     Review of Systems: No other skin or systemic complaints except as noted in HPI or Assessment and Plan.  Objective  Well appearing patient in no apparent distress; mood and affect are within normal limits.  A full examination was performed including scalp, head, eyes, ears, nose, lips, neck, chest, axillae, abdomen, back, buttocks, bilateral upper extremities, bilateral lower extremities, hands, feet, fingers, toes, fingernails, and toenails. All findings within normal limits unless otherwise noted below.  nose and forehead x 6 (6) Erythematous thin papules/macules with gritty scale.   right infraorbital x 1 Erythematous stuck-on, waxy papule or plaque   Assessment & Plan  Actinic keratosis (6) nose and forehead x 6 Actinic keratoses are precancerous spots that appear secondary to cumulative UV radiation exposure/sun exposure over time. They are chronic with expected duration over 1 year. A portion of actinic keratoses will progress to squamous cell carcinoma of the skin. It is not possible to reliably predict which spots will progress to skin cancer and so treatment is recommended to prevent development of skin cancer.  Recommend daily broad spectrum sunscreen SPF 30+ to sun-exposed areas, reapply every 2 hours as needed.  Recommend staying in the shade or  wearing long sleeves, sun glasses (UVA+UVB protection) and wide brim hats (4-inch brim around the entire circumference of the hat). Call for new or changing lesions.  Destruction of lesion - nose and forehead x 6 Complexity: simple   Destruction method: cryotherapy   Informed consent: discussed and consent obtained   Timeout:  patient name, date of birth, surgical site, and procedure verified Lesion destroyed using liquid nitrogen: Yes   Region frozen until ice ball extended beyond lesion: Yes   Outcome: patient tolerated procedure well with no complications   Post-procedure details: wound care instructions given   Additional details:  Prior to procedure, discussed risks of blister formation, small wound, skin dyspigmentation, or rare scar following cryotherapy. Recommend Vaseline ointment to treated areas while healing.  Inflamed seborrheic keratosis right infraorbital x 1 Symptomatic, irritating, patient would like treated. Destruction of lesion - right infraorbital x 1 Complexity: simple   Destruction method: cryotherapy   Informed consent: discussed and consent obtained   Timeout:  patient name, date of birth, surgical site, and procedure verified Lesion destroyed using liquid nitrogen: Yes   Region frozen until ice ball extended beyond lesion: Yes   Outcome: patient tolerated procedure well with no complications   Post-procedure details: wound care instructions given   Additional details:  Prior to procedure, discussed risks of blister formation, small wound, skin dyspigmentation, or rare scar following cryotherapy. Recommend Vaseline ointment to treated areas while healing.   Lentigines - Scattered tan macules - Due to sun exposure - Benign-appearing, observe - Recommend daily broad spectrum sunscreen SPF 30+  to sun-exposed areas, reapply every 2 hours as needed. - Call for any changes  Seborrheic Keratoses - Stuck-on, waxy, tan-brown papules and/or plaques  -  Benign-appearing - Discussed benign etiology and prognosis. - Observe - Call for any changes  Melanocytic Nevi - Tan-brown and/or pink-flesh-colored symmetric macules and papules - Benign appearing on exam today - Observation - Call clinic for new or changing moles - Recommend daily use of broad spectrum spf 30+ sunscreen to sun-exposed areas.   Hemangiomas - Red papules - Discussed benign nature - Observe - Call for any changes  Actinic Damage - Chronic condition, secondary to cumulative UV/sun exposure - diffuse scaly erythematous macules with underlying dyspigmentation - Recommend daily broad spectrum sunscreen SPF 30+ to sun-exposed areas, reapply every 2 hours as needed.  - Staying in the shade or wearing long sleeves, sun glasses (UVA+UVB protection) and wide brim hats (4-inch brim around the entire circumference of the hat) are also recommended for sun protection.  - Call for new or changing lesions.  History of Melanoma Right posterior thigh 10/21/2017 - No evidence of recurrence today - No lymphadenopathy - Recommend regular full body skin exams - Recommend daily broad spectrum sunscreen SPF 30+ to sun-exposed areas, reapply every 2 hours as needed.  - Call if any new or changing lesions are noted between office visits  Skin cancer screening performed today. Return in about 4 months (around 01/04/2023) for ak followup , 1 year tbse . IRuthell Rummage, CMA, am acting as scribe for Sarina Ser, MD. Documentation: I have reviewed the above documentation for accuracy and completeness, and I agree with the above.  Sarina Ser, MD

## 2022-09-04 NOTE — Patient Instructions (Addendum)
For irritation of cartilage of nose call Ear Nose and throat.    Seborrheic Keratosis  What causes seborrheic keratoses? Seborrheic keratoses are harmless, common skin growths that first appear during adult life.  As time goes by, more growths appear.  Some people may develop a large number of them.  Seborrheic keratoses appear on both covered and uncovered body parts.  They are not caused by sunlight.  The tendency to develop seborrheic keratoses can be inherited.  They vary in color from skin-colored to gray, brown, or even black.  They can be either smooth or have a rough, warty surface.   Seborrheic keratoses are superficial and look as if they were stuck on the skin.  Under the microscope this type of keratosis looks like layers upon layers of skin.  That is why at times the top layer may seem to fall off, but the rest of the growth remains and re-grows.    Treatment Seborrheic keratoses do not need to be treated, but can easily be removed in the office.  Seborrheic keratoses often cause symptoms when they rub on clothing or jewelry.  Lesions can be in the way of shaving.  If they become inflamed, they can cause itching, soreness, or burning.  Removal of a seborrheic keratosis can be accomplished by freezing, burning, or surgery. If any spot bleeds, scabs, or grows rapidly, please return to have it checked, as these can be an indication of a skin cancer.     Actinic keratoses are precancerous spots that appear secondary to cumulative UV radiation exposure/sun exposure over time. They are chronic with expected duration over 1 year. A portion of actinic keratoses will progress to squamous cell carcinoma of the skin. It is not possible to reliably predict which spots will progress to skin cancer and so treatment is recommended to prevent development of skin cancer.  Recommend daily broad spectrum sunscreen SPF 30+ to sun-exposed areas, reapply every 2 hours as needed.  Recommend staying in the  shade or wearing long sleeves, sun glasses (UVA+UVB protection) and wide brim hats (4-inch brim around the entire circumference of the hat). Call for new or changing lesions.   Cryotherapy Aftercare  Wash gently with soap and water everyday.   Apply Vaseline and Band-Aid daily until healed.    Melanoma ABCDEs  Melanoma is the most dangerous type of skin cancer, and is the leading cause of death from skin disease.  You are more likely to develop melanoma if you: Have light-colored skin, light-colored eyes, or red or blond hair Spend a lot of time in the sun Tan regularly, either outdoors or in a tanning bed Have had blistering sunburns, especially during childhood Have a close family member who has had a melanoma Have atypical moles or large birthmarks  Early detection of melanoma is key since treatment is typically straightforward and cure rates are extremely high if we catch it early.   The first sign of melanoma is often a change in a mole or a new dark spot.  The ABCDE system is a way of remembering the signs of melanoma.  A for asymmetry:  The two halves do not match. B for border:  The edges of the growth are irregular. C for color:  A mixture of colors are present instead of an even brown color. D for diameter:  Melanomas are usually (but not always) greater than 74m - the size of a pencil eraser. E for evolution:  The spot keeps changing in size, shape,  and color.  Please check your skin once per month between visits. You can use a small mirror in front and a large mirror behind you to keep an eye on the back side or your body.   If you see any new or changing lesions before your next follow-up, please call to schedule a visit.  Please continue daily skin protection including broad spectrum sunscreen SPF 30+ to sun-exposed areas, reapplying every 2 hours as needed when you're outdoors.   Staying in the shade or wearing long sleeves, sun glasses (UVA+UVB protection) and wide  brim hats (4-inch brim around the entire circumference of the hat) are also recommended for sun protection.    Due to recent changes in healthcare laws, you may see results of your pathology and/or laboratory studies on MyChart before the doctors have had a chance to review them. We understand that in some cases there may be results that are confusing or concerning to you. Please understand that not all results are received at the same time and often the doctors may need to interpret multiple results in order to provide you with the best plan of care or course of treatment. Therefore, we ask that you please give [place] 2 business days to thoroughly review all your results before contacting the office for clarification. Should we see a critical lab result, you will be contacted sooner.   If You Need Anything After Your Visit  If you have any questions or concerns for your doctor, please call our main line at [phone] and press option 4 to reach your doctor's medical assistant. If no one answers, please leave a voicemail as directed and we will return your call as soon as possible. Messages left after 4 pm will be answered the following business day.   You may also send [place] a message via [place]. We typically respond to MyChart messages within 1-2 business days.  For prescription refills, please ask your pharmacy to contact our office. Our fax number is [phone].  If you have an urgent issue when the clinic is closed that cannot wait until the next business day, you can page your doctor at the number below.    Please note that while we do our best to be available for urgent issues outside of office hours, we are not available 24/7.   If you have an urgent issue and are unable to reach [place], you may choose to seek medical care at your doctor's office, retail clinic, urgent care center, or emergency room.  If you have a medical emergency, please immediately call 911 or go to the emergency  department.  Pager Numbers  - Dr. Nehemiah Massed: [phone]  - Dr. Laurence Ferrari: [phone]  - Dr. Nicole Kindred: [phone]  In the event of inclement weather, please call our main line at [phone] for an update on the status of any delays or closures.  Dermatology Medication Tips: Please keep the boxes that topical medications come in in order to help keep track of the instructions about where and how to use these. Pharmacies typically print the medication instructions only on the boxes and not directly on the medication tubes.   If your medication is too expensive, please contact our office at [phone] option 4 or send [place] a message through [place].   We are unable to tell what your co-pay for medications will be in advance as this is different depending on your insurance coverage. However, we may be able to find a substitute medication at lower cost or  fill out paperwork to get insurance to cover a needed medication.   If a prior authorization is required to get your medication covered by your insurance company, please allow [place] 1-2 business days to complete this process.  Drug prices often vary depending on where the prescription is filled and some pharmacies may offer cheaper prices.  The website www.goodrx.com contains coupons for medications through different pharmacies. The prices here do not account for what the cost may be with help from insurance (it may be cheaper with your insurance), but the website can give you the price if you did not use any insurance.  - You can print the associated coupon and take it with your prescription to the pharmacy.  - You may also stop by our office during regular business hours and pick up a GoodRx coupon card.  - If you need your prescription sent electronically to a different pharmacy, notify our office through [facility] or by phone at [phone] option 4.     Si Usted Necesita Algo Despus de Su Visita  Tambin puede enviarnos un  mensaje a travs de Pharmacist, community. Por lo general respondemos a los mensajes de MyChart en el transcurso de 1 a 2 das hbiles.  Para renovar recetas, por favor pida a su farmacia que se ponga en contacto con nuestra oficina. Harland Dingwall de fax es [place] [phone].  Si tiene un asunto urgente cuando la clnica est cerrada y que no puede esperar hasta el siguiente da hbil, puede llamar/localizar a su doctor(a) al nmero que aparece a continuacin.   Por favor, tenga en cuenta que aunque hacemos todo lo posible para estar disponibles para asuntos urgentes fuera del horario de [place], no estamos disponibles las 24 horas del da, los 7 das de la [place].   Si tiene un problema urgente y no puede comunicarse con nosotros, puede optar por buscar atencin mdica  en el consultorio de su doctor(a), en una clnica privada, en un centro de atencin urgente o en una sala de emergencias.  Si tiene Engineering geologist, por favor llame inmediatamente al 911 o vaya a la sala de emergencias.  Nmeros de bper  - Dr. Nehemiah Massed: [phone]  - Dra. Moye: [phone]  - Dra. Nicole Kindred: [phone]  En caso de inclemencias del [place], por favor llame a Johnsie Kindred principal al [phone] para una actualizacin sobre el [place] de cualquier retraso o cierre.  Consejos para la medicacin en dermatologa: Por favor, guarde las cajas en las que vienen los medicamentos de uso tpico para ayudarle a seguir las instrucciones sobre dnde y cmo usarlos. Las farmacias generalmente imprimen las instrucciones del medicamento slo en las cajas y no directamente en los tubos del [place].   Si su medicamento es muy caro, por favor, pngase en contacto con Zigmund Daniel llamando al [phone] y presione la opcin 4 o envenos un mensaje a travs de Pharmacist, community.   No podemos decirle cul ser su copago por los medicamentos por adelantado ya que esto es diferente dependiendo de la cobertura de su seguro. Sin embargo,  es posible que podamos encontrar un medicamento sustituto a Electrical engineer un formulario para que el seguro cubra el medicamento que se considera necesario.   Si se requiere una autorizacin previa para que su compaa de seguros [place] su medicamento, por favor permtanos de 1 a 2 das hbiles para completar este proceso.  Los precios de los medicamentos varan con frecuencia dependiendo del Environmental consultant de dnde se surte la receta  y [place] farmacias pueden ofrecer precios ms baratos.  El sitio web www.goodrx.com tiene cupones para medicamentos de Airline pilot. Los precios aqu no tienen en cuenta lo que podra costar con la ayuda del seguro (puede ser ms barato con su seguro), pero el sitio web puede darle el precio si no utiliz Research scientist (physical sciences).  - Puede imprimir el cupn correspondiente y llevarlo con su receta a la farmacia.  - Tambin puede pasar por nuestra oficina durante el horario de atencin regular y Charity fundraiser una tarjeta de cupones de GoodRx.  - Si necesita que su receta se enve electrnicamente a una farmacia diferente, informe a nuestra oficina a travs de MyChart de [place] o por telfono llamando al [phone] y presione la opcin 4.

## 2022-09-19 ENCOUNTER — Encounter: Payer: Self-pay | Admitting: Dermatology

## 2022-12-26 ENCOUNTER — Ambulatory Visit: Payer: Medicare Other | Admitting: Dermatology

## 2023-02-06 ENCOUNTER — Ambulatory Visit: Payer: 59 | Admitting: Dermatology

## 2023-03-03 ENCOUNTER — Other Ambulatory Visit: Payer: Self-pay | Admitting: Internal Medicine

## 2023-03-03 DIAGNOSIS — Z1231 Encounter for screening mammogram for malignant neoplasm of breast: Secondary | ICD-10-CM

## 2023-04-09 ENCOUNTER — Ambulatory Visit
Admission: RE | Admit: 2023-04-09 | Discharge: 2023-04-09 | Disposition: A | Discharge status: Home / Self Care | Payer: 59 | Source: Ambulatory Visit | Attending: Internal Medicine | Admitting: Internal Medicine

## 2023-04-09 DIAGNOSIS — Z1231 Encounter for screening mammogram for malignant neoplasm of breast: Secondary | ICD-10-CM | POA: Diagnosis not present

## 2023-05-07 ENCOUNTER — Ambulatory Visit (INDEPENDENT_AMBULATORY_CARE_PROVIDER_SITE_OTHER): Payer: 59 | Admitting: Dermatology

## 2023-05-07 VITALS — BP 131/76

## 2023-05-07 DIAGNOSIS — D1801 Hemangioma of skin and subcutaneous tissue: Secondary | ICD-10-CM

## 2023-05-07 DIAGNOSIS — L578 Other skin changes due to chronic exposure to nonionizing radiation: Secondary | ICD-10-CM

## 2023-05-07 DIAGNOSIS — Z1283 Encounter for screening for malignant neoplasm of skin: Secondary | ICD-10-CM

## 2023-05-07 DIAGNOSIS — L821 Other seborrheic keratosis: Secondary | ICD-10-CM

## 2023-05-07 DIAGNOSIS — L814 Other melanin hyperpigmentation: Secondary | ICD-10-CM

## 2023-05-07 DIAGNOSIS — Z85820 Personal history of malignant melanoma of skin: Secondary | ICD-10-CM

## 2023-05-07 DIAGNOSIS — W908XXA Exposure to other nonionizing radiation, initial encounter: Secondary | ICD-10-CM

## 2023-05-07 DIAGNOSIS — Z872 Personal history of diseases of the skin and subcutaneous tissue: Secondary | ICD-10-CM

## 2023-05-07 DIAGNOSIS — L570 Actinic keratosis: Secondary | ICD-10-CM | POA: Diagnosis not present

## 2023-05-07 DIAGNOSIS — L820 Inflamed seborrheic keratosis: Secondary | ICD-10-CM

## 2023-05-07 DIAGNOSIS — D229 Melanocytic nevi, unspecified: Secondary | ICD-10-CM

## 2023-05-07 NOTE — Progress Notes (Signed)
Follow-Up Visit   Subjective  Mia Gray is a 67 y.o. female who presents for the following: Skin Cancer Screening and Full Body Skin Exam, hx of Melanoma, hx AKs  The patient presents for Total-Body Skin Exam (TBSE) for skin cancer screening and mole check. The patient has spots, moles and lesions to be evaluated, some may be new or changing and the patient may have concern these could be cancer.    The following portions of the chart were reviewed this encounter and updated as appropriate: medications, allergies, medical history  Review of Systems:  No other skin or systemic complaints except as noted in HPI or Assessment and Plan.  Objective  Well appearing patient in no apparent distress; mood and affect are within normal limits.  A full examination was performed including scalp, head, eyes, ears, nose, lips, neck, chest, axillae, abdomen, back, buttocks, bilateral upper extremities, bilateral lower extremities, hands, feet, fingers, toes, fingernails, and toenails. All findings within normal limits unless otherwise noted below.   Relevant physical exam findings are noted in the Assessment and Plan.  L forearm x 2 (2) Stuck on waxy paps with erythema  R infraorbital x 1 Pink scaly macules         Assessment & Plan   SKIN CANCER SCREENING PERFORMED TODAY.  ACTINIC DAMAGE - Chronic condition, secondary to cumulative UV/sun exposure - diffuse scaly erythematous macules with underlying dyspigmentation - Recommend daily broad spectrum sunscreen SPF 30+ to sun-exposed areas, reapply every 2 hours as needed.  - Staying in the shade or wearing long sleeves, sun glasses (UVA+UVB protection) and wide brim hats (4-inch brim around the entire circumference of the hat) are also recommended for sun protection.  - Call for new or changing lesions.  LENTIGINES, SEBORRHEIC KERATOSES, HEMANGIOMAS - Benign normal skin lesions - Benign-appearing - Call for any  changes  MELANOCYTIC NEVI - Tan-brown and/or pink-flesh-colored symmetric macules and papules - Benign appearing on exam today - Observation - Call clinic for new or changing moles - Recommend daily use of broad spectrum spf 30+ sunscreen to sun-exposed areas.   HISTORY OF MELANOMA, INVASIVE  - No evidence of recurrence today - No lymphadenopathy - Recommend regular full body skin exams - Recommend daily broad spectrum sunscreen SPF 30+ to sun-exposed areas, reapply every 2 hours as needed.  - Call if any new or changing lesions are noted between office visits  - R post thigh 10/21/2017 tumor thickness 0.72mm, Anatomic level III   Inflamed seborrheic keratosis (2) L forearm x 2  Symptomatic, irritating, patient would like treated.   Destruction of lesion - L forearm x 2 (2) Complexity: simple   Destruction method: cryotherapy   Informed consent: discussed and consent obtained   Timeout:  patient name, date of birth, surgical site, and procedure verified Lesion destroyed using liquid nitrogen: Yes   Region frozen until ice ball extended beyond lesion: Yes   Outcome: patient tolerated procedure well with no complications   Post-procedure details: wound care instructions given    AK (actinic keratosis) R infraorbital x 1  Actinic keratoses are precancerous spots that appear secondary to cumulative UV radiation exposure/sun exposure over time. They are chronic with expected duration over 1 year. A portion of actinic keratoses will progress to squamous cell carcinoma of the skin. It is not possible to reliably predict which spots will progress to skin cancer and so treatment is recommended to prevent development of skin cancer.  Recommend daily broad spectrum sunscreen SPF  30+ to sun-exposed areas, reapply every 2 hours as needed.  Recommend staying in the shade or wearing long sleeves, sun glasses (UVA+UVB protection) and wide brim hats (4-inch brim around the entire circumference  of the hat). Call for new or changing lesions.  Destruction of lesion - R infraorbital x 1 Complexity: simple   Destruction method: cryotherapy   Informed consent: discussed and consent obtained   Timeout:  patient name, date of birth, surgical site, and procedure verified Lesion destroyed using liquid nitrogen: Yes   Region frozen until ice ball extended beyond lesion: Yes   Outcome: patient tolerated procedure well with no complications   Post-procedure details: wound care instructions given       Return in about 6 months (around 11/07/2023) for AK f/u, recheck R infraorbital, 1 yr TBSE , Hx of Melanoma, Hx of AKs.  I, Ardis Rowan, RMA, am acting as scribe for Armida Sans, MD .   Documentation: I have reviewed the above documentation for accuracy and completeness, and I agree with the above.  Armida Sans, MD

## 2023-05-07 NOTE — Patient Instructions (Addendum)

## 2023-05-18 ENCOUNTER — Encounter: Payer: Self-pay | Admitting: Dermatology

## 2023-07-08 ENCOUNTER — Encounter
Admission: EM | Disposition: A | Discharge status: Home Health | Payer: Self-pay | Source: Home / Self Care | Attending: Surgery

## 2023-07-08 ENCOUNTER — Inpatient Hospital Stay: Payer: 59 | Admitting: Anesthesiology

## 2023-07-08 ENCOUNTER — Encounter
Admission: EM | Disposition: A | Discharge status: Home Health | Payer: 59 | Source: Home / Self Care | Attending: Surgery

## 2023-07-08 ENCOUNTER — Inpatient Hospital Stay
Admission: EM | Admit: 2023-07-08 | Discharge: 2023-07-15 | DRG: 330 | Disposition: A | Discharge status: Home Health | Payer: 59 | Attending: Surgery | Admitting: Surgery

## 2023-07-08 ENCOUNTER — Emergency Department: Payer: 59

## 2023-07-08 ENCOUNTER — Other Ambulatory Visit: Payer: Self-pay

## 2023-07-08 DIAGNOSIS — G43909 Migraine, unspecified, not intractable, without status migrainosus: Secondary | ICD-10-CM | POA: Diagnosis present

## 2023-07-08 DIAGNOSIS — K567 Ileus, unspecified: Secondary | ICD-10-CM | POA: Diagnosis not present

## 2023-07-08 DIAGNOSIS — K436 Other and unspecified ventral hernia with obstruction, without gangrene: Principal | ICD-10-CM | POA: Diagnosis present

## 2023-07-08 DIAGNOSIS — K668 Other specified disorders of peritoneum: Secondary | ICD-10-CM | POA: Diagnosis present

## 2023-07-08 DIAGNOSIS — R633 Feeding difficulties, unspecified: Secondary | ICD-10-CM | POA: Diagnosis present

## 2023-07-08 DIAGNOSIS — Z885 Allergy status to narcotic agent status: Secondary | ICD-10-CM | POA: Diagnosis not present

## 2023-07-08 DIAGNOSIS — K5712 Diverticulitis of small intestine without perforation or abscess without bleeding: Secondary | ICD-10-CM | POA: Diagnosis present

## 2023-07-08 DIAGNOSIS — Z635 Disruption of family by separation and divorce: Secondary | ICD-10-CM | POA: Diagnosis not present

## 2023-07-08 DIAGNOSIS — I10 Essential (primary) hypertension: Secondary | ICD-10-CM | POA: Diagnosis present

## 2023-07-08 DIAGNOSIS — L570 Actinic keratosis: Secondary | ICD-10-CM | POA: Diagnosis present

## 2023-07-08 DIAGNOSIS — K5700 Diverticulitis of small intestine with perforation and abscess without bleeding: Principal | ICD-10-CM | POA: Diagnosis present

## 2023-07-08 DIAGNOSIS — Z79899 Other long term (current) drug therapy: Secondary | ICD-10-CM

## 2023-07-08 DIAGNOSIS — K5710 Diverticulosis of small intestine without perforation or abscess without bleeding: Secondary | ICD-10-CM | POA: Diagnosis present

## 2023-07-08 DIAGNOSIS — G8929 Other chronic pain: Secondary | ICD-10-CM | POA: Diagnosis present

## 2023-07-08 DIAGNOSIS — R109 Unspecified abdominal pain: Principal | ICD-10-CM

## 2023-07-08 DIAGNOSIS — Z85820 Personal history of malignant melanoma of skin: Secondary | ICD-10-CM | POA: Diagnosis not present

## 2023-07-08 DIAGNOSIS — K631 Perforation of intestine (nontraumatic): Secondary | ICD-10-CM | POA: Diagnosis present

## 2023-07-08 HISTORY — PX: LAPAROTOMY: SHX154

## 2023-07-08 HISTORY — PX: VENTRAL HERNIA REPAIR: SHX424

## 2023-07-08 LAB — LIPASE, BLOOD: Lipase: 24 U/L (ref 11–51)

## 2023-07-08 LAB — COMPREHENSIVE METABOLIC PANEL
ALT: 15 U/L (ref 0–44)
AST: 23 U/L (ref 15–41)
Albumin: 3.9 g/dL (ref 3.5–5.0)
Alkaline Phosphatase: 64 U/L (ref 38–126)
Anion gap: 10 (ref 5–15)
BUN: 14 mg/dL (ref 8–23)
CO2: 19 mmol/L — ABNORMAL LOW (ref 22–32)
Calcium: 8.4 mg/dL — ABNORMAL LOW (ref 8.9–10.3)
Chloride: 108 mmol/L (ref 98–111)
Creatinine, Ser: 0.95 mg/dL (ref 0.44–1.00)
GFR, Estimated: >60 mL/min (ref >60)
Glucose, Bld: 118 mg/dL — ABNORMAL HIGH (ref 70–99)
Potassium: 4.2 mmol/L (ref 3.5–5.1)
Sodium: 137 mmol/L (ref 135–145)
Total Bilirubin: 1.1 mg/dL (ref 0.3–1.2)
Total Protein: 7.2 g/dL (ref 6.5–8.1)

## 2023-07-08 LAB — TYPE AND SCREEN
ABO/RH(D): A POS
Antibody Screen: NEGATIVE

## 2023-07-08 LAB — CBC
HCT: 41.8 % (ref 36.0–46.0)
Hemoglobin: 14.6 g/dL (ref 12.0–15.0)
MCH: 31.2 pg (ref 26.0–34.0)
MCHC: 34.9 g/dL (ref 30.0–36.0)
MCV: 89.3 fL (ref 80.0–100.0)
Platelets: 256 10*3/uL (ref 150–400)
RBC: 4.68 MIL/uL (ref 3.87–5.11)
RDW: 12.9 % (ref 11.5–15.5)
WBC: 11.7 10*3/uL — ABNORMAL HIGH (ref 4.0–10.5)
nRBC: 0 % (ref 0.0–0.2)

## 2023-07-08 LAB — URINALYSIS, ROUTINE W REFLEX MICROSCOPIC
Bilirubin Urine: NEGATIVE
Glucose, UA: NEGATIVE mg/dL
Hgb urine dipstick: NEGATIVE
Ketones, ur: NEGATIVE mg/dL
Leukocytes,Ua: NEGATIVE
Nitrite: NEGATIVE
Protein, ur: NEGATIVE mg/dL
Specific Gravity, Urine: >1.046 — ABNORMAL HIGH (ref 1.005–1.030)
pH: 7 (ref 5.0–8.0)

## 2023-07-08 SURGERY — LAPAROTOMY, EXPLORATORY
Anesthesia: General | Site: Abdomen

## 2023-07-08 SURGERY — EXPLORATORY LAPAROTOMY
Anesthesia: General

## 2023-07-08 MED ORDER — PROPOFOL 10 MG/ML IV BOLUS
INTRAVENOUS | Status: DC | PRN
  Administered 2023-07-08: 130 mg via INTRAVENOUS

## 2023-07-08 MED ORDER — OXYCODONE HCL 5 MG PO TABS
5.0000 mg | ORAL_TABLET | ORAL | Status: DC | PRN
  Administered 2023-07-08 – 2023-07-09 (×4): 5 mg via ORAL
  Filled 2023-07-08 (×4): qty 1

## 2023-07-08 MED ORDER — SUGAMMADEX SODIUM 200 MG/2ML IV SOLN
INTRAVENOUS | Status: DC | PRN
  Administered 2023-07-08: 272 mg via INTRAVENOUS

## 2023-07-08 MED ORDER — PANTOPRAZOLE INFUSION (NEW) - SIMPLE MED
8.0000 mg/h | INTRAVENOUS | Status: DC
  Filled 2023-07-08: qty 100

## 2023-07-08 MED ORDER — FENTANYL CITRATE (PF) 100 MCG/2ML IJ SOLN
INTRAMUSCULAR | Status: DC | PRN
  Administered 2023-07-08: 50 ug via INTRAVENOUS
  Administered 2023-07-08 (×2): 25 ug via INTRAVENOUS

## 2023-07-08 MED ORDER — ACETAMINOPHEN 500 MG PO TABS
1000.0000 mg | ORAL_TABLET | Freq: Four times a day (QID) | ORAL | Status: DC
  Administered 2023-07-08 – 2023-07-13 (×16): 1000 mg via ORAL
  Filled 2023-07-08 (×16): qty 2

## 2023-07-08 MED ORDER — ROCURONIUM BROMIDE 100 MG/10ML IV SOLN
INTRAVENOUS | Status: DC | PRN
  Administered 2023-07-08: 100 mg via INTRAVENOUS

## 2023-07-08 MED ORDER — SODIUM CHLORIDE (PF) 0.9 % IJ SOLN
INTRAMUSCULAR | Status: AC
  Filled 2023-07-08: qty 50

## 2023-07-08 MED ORDER — LIDOCAINE HCL (CARDIAC) PF 100 MG/5ML IV SOSY
PREFILLED_SYRINGE | INTRAVENOUS | Status: DC | PRN
  Administered 2023-07-08: 60 mg via INTRAVENOUS

## 2023-07-08 MED ORDER — LACTATED RINGERS IV SOLN: INTRAVENOUS | Status: DC | PRN

## 2023-07-08 MED ORDER — SODIUM CHLORIDE 0.9 % IV SOLN: INTRAVENOUS | Status: AC

## 2023-07-08 MED ORDER — SEPRAFILM FOR OPTIME
ORAL_FILM | TOPICAL | Status: DC | PRN
Start: 2023-07-08 — End: 2023-07-08
  Administered 2023-07-08: 4 via TOPICAL

## 2023-07-08 MED ORDER — PHENYLEPHRINE 80 MCG/ML (10ML) SYRINGE FOR IV PUSH (FOR BLOOD PRESSURE SUPPORT)
PREFILLED_SYRINGE | INTRAVENOUS | Status: DC | PRN
  Administered 2023-07-08: 160 ug via INTRAVENOUS
  Administered 2023-07-08: 80 ug via INTRAVENOUS
  Administered 2023-07-08: 160 ug via INTRAVENOUS
  Administered 2023-07-08: 80 ug via INTRAVENOUS

## 2023-07-08 MED ORDER — DROPERIDOL 2.5 MG/ML IJ SOLN
0.6250 mg | Freq: Once | INTRAMUSCULAR | Status: DC
  Administered 2023-07-08: 0.625 mg via INTRAVENOUS

## 2023-07-08 MED ORDER — DROPERIDOL 2.5 MG/ML IJ SOLN
0.6250 mg | Freq: Once | INTRAMUSCULAR | Status: AC | PRN
  Administered 2023-07-08: 0.625 mg via INTRAVENOUS

## 2023-07-08 MED ORDER — HYDROMORPHONE HCL 1 MG/ML IJ SOLN
INTRAMUSCULAR | Status: AC
  Filled 2023-07-08: qty 1

## 2023-07-08 MED ORDER — PANTOPRAZOLE 80MG IVPB - SIMPLE MED
80.0000 mg | Freq: Once | INTRAVENOUS | Status: AC
  Administered 2023-07-08: 80 mg via INTRAVENOUS
  Filled 2023-07-08: qty 100

## 2023-07-08 MED ORDER — IRRISEPT - 450ML BOTTLE WITH 0.05% CHG IN STERILE WATER, USP 99.95% OPTIME
TOPICAL | Status: DC | PRN
Start: 2023-07-08 — End: 2023-07-08
  Administered 2023-07-08: 450 mL

## 2023-07-08 MED ORDER — OXYCODONE HCL 5 MG/5ML PO SOLN: 5.0000 mg | Freq: Once | ORAL | Status: DC | PRN

## 2023-07-08 MED ORDER — MORPHINE SULFATE (PF) 2 MG/ML IV SOLN: 2.0000 mg | INTRAVENOUS | Status: DC | PRN

## 2023-07-08 MED ORDER — OXYCODONE HCL 5 MG PO TABS: 5.0000 mg | ORAL_TABLET | Freq: Once | ORAL | Status: DC | PRN

## 2023-07-08 MED ORDER — PIPERACILLIN-TAZOBACTAM 3.375 G IVPB 30 MIN
3.3750 g | Freq: Once | INTRAVENOUS | Status: AC
  Administered 2023-07-08: 3.375 g via INTRAVENOUS
  Filled 2023-07-08: qty 50

## 2023-07-08 MED ORDER — PHENYLEPHRINE 80 MCG/ML (10ML) SYRINGE FOR IV PUSH (FOR BLOOD PRESSURE SUPPORT)
PREFILLED_SYRINGE | INTRAVENOUS | Status: AC
  Filled 2023-07-08: qty 10

## 2023-07-08 MED ORDER — DEXMEDETOMIDINE HCL IN NACL 80 MCG/20ML IV SOLN
INTRAVENOUS | Status: DC | PRN
Start: 2023-07-08 — End: 2023-07-08
  Administered 2023-07-08: 4 ug via INTRAVENOUS

## 2023-07-08 MED ORDER — HYDROMORPHONE HCL 1 MG/ML IJ SOLN
INTRAMUSCULAR | Status: DC | PRN
Start: 2023-07-08 — End: 2023-07-08
  Administered 2023-07-08: .5 mg via INTRAVENOUS

## 2023-07-08 MED ORDER — BUPIVACAINE-EPINEPHRINE (PF) 0.25% -1:200000 IJ SOLN
INTRAMUSCULAR | Status: AC
  Filled 2023-07-08: qty 30

## 2023-07-08 MED ORDER — FENTANYL CITRATE (PF) 100 MCG/2ML IJ SOLN: 25.0000 ug | INTRAMUSCULAR | Status: DC | PRN

## 2023-07-08 MED ORDER — MIDAZOLAM HCL 2 MG/2ML IJ SOLN
INTRAMUSCULAR | Status: AC
  Filled 2023-07-08: qty 2

## 2023-07-08 MED ORDER — MORPHINE SULFATE (PF) 4 MG/ML IV SOLN
4.0000 mg | Freq: Once | INTRAVENOUS | Status: AC
  Administered 2023-07-08: 4 mg via INTRAVENOUS
  Filled 2023-07-08: qty 1

## 2023-07-08 MED ORDER — FENTANYL CITRATE (PF) 100 MCG/2ML IJ SOLN
INTRAMUSCULAR | Status: AC
  Filled 2023-07-08: qty 2

## 2023-07-08 MED ORDER — ACETAMINOPHEN 10 MG/ML IV SOLN: 1000.0000 mg | Freq: Once | INTRAVENOUS | Status: DC | PRN

## 2023-07-08 MED ORDER — IOHEXOL 300 MG/ML  SOLN
100.0000 mL | Freq: Once | INTRAMUSCULAR | Status: AC | PRN
  Administered 2023-07-08: 100 mL via INTRAVENOUS

## 2023-07-08 MED ORDER — SODIUM CHLORIDE (PF) 0.9 % IJ SOLN
INTRAMUSCULAR | Status: DC | PRN
  Administered 2023-07-08: 100 mL

## 2023-07-08 MED ORDER — ONDANSETRON HCL 4 MG/2ML IJ SOLN
INTRAMUSCULAR | Status: DC | PRN
  Administered 2023-07-08: 4 mg via INTRAVENOUS

## 2023-07-08 MED ORDER — BUPIVACAINE LIPOSOME 1.3 % IJ SUSP
INTRAMUSCULAR | Status: AC
  Filled 2023-07-08: qty 20

## 2023-07-08 MED ORDER — ONDANSETRON 4 MG PO TBDP
4.0000 mg | ORAL_TABLET | Freq: Four times a day (QID) | ORAL | Status: DC | PRN
  Administered 2023-07-08: 4 mg via ORAL
  Filled 2023-07-08: qty 1

## 2023-07-08 MED ORDER — DROPERIDOL 2.5 MG/ML IJ SOLN
INTRAMUSCULAR | Status: AC
  Filled 2023-07-08: qty 2

## 2023-07-08 MED ORDER — SEVOFLURANE IN SOLN
RESPIRATORY_TRACT | Status: AC
  Filled 2023-07-08: qty 250

## 2023-07-08 MED ORDER — ONDANSETRON HCL 4 MG/2ML IJ SOLN: 4.0000 mg | Freq: Four times a day (QID) | INTRAMUSCULAR | Status: DC | PRN

## 2023-07-08 MED ORDER — PANTOPRAZOLE SODIUM 40 MG IV SOLR
40.0000 mg | Freq: Two times a day (BID) | INTRAVENOUS | Status: DC
  Administered 2023-07-12 – 2023-07-13 (×4): 40 mg via INTRAVENOUS
  Filled 2023-07-08 (×8): qty 10

## 2023-07-08 MED ORDER — PROPOFOL 10 MG/ML IV BOLUS
INTRAVENOUS | Status: AC
  Filled 2023-07-08: qty 20

## 2023-07-08 MED ORDER — PHENYLEPHRINE HCL-NACL 20-0.9 MG/250ML-% IV SOLN
INTRAVENOUS | Status: AC
  Filled 2023-07-08: qty 250

## 2023-07-08 MED ORDER — PIPERACILLIN-TAZOBACTAM 3.375 G IVPB
3.3750 g | Freq: Three times a day (TID) | INTRAVENOUS | Status: AC
  Administered 2023-07-08 – 2023-07-13 (×16): 3.375 g via INTRAVENOUS
  Filled 2023-07-08 (×15): qty 50

## 2023-07-08 MED ORDER — DEXAMETHASONE SODIUM PHOSPHATE 10 MG/ML IJ SOLN
INTRAMUSCULAR | Status: DC | PRN
  Administered 2023-07-08: 10 mg via INTRAVENOUS

## 2023-07-08 MED ORDER — 0.9 % SODIUM CHLORIDE (POUR BTL) OPTIME
TOPICAL | Status: DC | PRN
  Administered 2023-07-08: 500 mL

## 2023-07-08 MED ORDER — LIDOCAINE HCL (PF) 2 % IJ SOLN
INTRAMUSCULAR | Status: AC
  Filled 2023-07-08: qty 5

## 2023-07-08 SURGICAL SUPPLY — 51 items
APL PRP STRL LF DISP 70% ISPRP (MISCELLANEOUS) ×1
APPLIER CLIP 11 MED OPEN (CLIP)
APPLIER CLIP 13 LRG OPEN (CLIP)
APR CLP LRG 13 20 CLIP (CLIP)
APR CLP MED 11 20 MLT OPN (CLIP)
BARRIER ADH SEPRAFILM 3INX5IN (MISCELLANEOUS) IMPLANT
BLADE CLIPPER SURG (BLADE) ×1 IMPLANT
BRR ADH 5X3 SEPRAFILM 2 SHT (MISCELLANEOUS) ×2
CHLORAPREP W/TINT 26 (MISCELLANEOUS) ×1 IMPLANT
CLIP APPLIE 11 MED OPEN (CLIP) IMPLANT
CLIP APPLIE 13 LRG OPEN (CLIP) IMPLANT
DRAPE LAPAROTOMY 100X77 ABD (DRAPES) ×1 IMPLANT
DRAPE TABLE BACK 80X90 (DRAPES) IMPLANT
DRAPE WARM FLUID 44X44 (DRAPES) IMPLANT
DRSG OPSITE POSTOP 4X10 (GAUZE/BANDAGES/DRESSINGS) IMPLANT
DRSG OPSITE POSTOP 4X12 (GAUZE/BANDAGES/DRESSINGS) IMPLANT
ELECT BLADE 6.5 EXT (BLADE) ×1 IMPLANT
ELECT CAUTERY BLADE 6.4 (BLADE) IMPLANT
ELECT REM PT RETURN 9FT ADLT (ELECTROSURGICAL) ×1
ELECTRODE REM PT RTRN 9FT ADLT (ELECTROSURGICAL) ×1 IMPLANT
GLOVE BIO SURGEON STRL SZ7 (GLOVE) ×2 IMPLANT
GOWN STRL REUS W/ TWL LRG LVL3 (GOWN DISPOSABLE) ×2 IMPLANT
GOWN STRL REUS W/TWL LRG LVL3 (GOWN DISPOSABLE) ×2
JET LAVAGE IRRISEPT WOUND (IRRIGATION / IRRIGATOR) ×1
LAVAGE JET IRRISEPT WOUND (IRRIGATION / IRRIGATOR) IMPLANT
LIGASURE IMPACT 36 18CM CVD LR (INSTRUMENTS) IMPLANT
MANIFOLD NEPTUNE II (INSTRUMENTS) ×1 IMPLANT
NDL HYPO 22X1.5 SAFETY MO (MISCELLANEOUS) ×2 IMPLANT
NEEDLE HYPO 22X1.5 SAFETY MO (MISCELLANEOUS) ×2 IMPLANT
PACK BASIN MAJOR ARMC (MISCELLANEOUS) ×1 IMPLANT
RELOAD PROXIMATE 75MM BLUE (ENDOMECHANICALS) ×4 IMPLANT
RELOAD STAPLE 75 3.8 BLU REG (ENDOMECHANICALS) IMPLANT
SPONGE T-LAP 18X18 ~~LOC~~+RFID (SPONGE) ×1 IMPLANT
SPONGE T-LAP 18X36 ~~LOC~~+RFID STR (SPONGE) IMPLANT
STAPLER PROXIMATE 75MM BLUE (STAPLE) IMPLANT
STAPLER SKIN PROX 35W (STAPLE) ×1 IMPLANT
SUT PDS AB 0 CT1 27 (SUTURE) ×3 IMPLANT
SUT SILK 2 0 (SUTURE) ×1
SUT SILK 2 0 SH CR/8 (SUTURE) ×1 IMPLANT
SUT SILK 2 0SH CR/8 30 (SUTURE) ×1 IMPLANT
SUT SILK 2-0 18XBRD TIE 12 (SUTURE) ×1 IMPLANT
SUT VIC AB 0 CT1 36 (SUTURE) ×2 IMPLANT
SUT VIC AB 2-0 SH 27 (SUTURE)
SUT VIC AB 2-0 SH 27XBRD (SUTURE) IMPLANT
SUT VIC AB 3-0 SH 27 (SUTURE) ×1
SUT VIC AB 3-0 SH 27X BRD (SUTURE) ×1 IMPLANT
SYR 20ML LL LF (SYRINGE) ×2 IMPLANT
SYR 3ML LL SCALE MARK (SYRINGE) ×1 IMPLANT
TRAP FLUID SMOKE EVACUATOR (MISCELLANEOUS) ×1 IMPLANT
TRAY FOLEY MTR SLVR 16FR STAT (SET/KITS/TRAYS/PACK) ×1 IMPLANT
WATER STERILE IRR 500ML POUR (IV SOLUTION) ×1 IMPLANT

## 2023-07-08 NOTE — ED Triage Notes (Signed)
Pt to ED for mid abd pain with nausea started yesterday,

## 2023-07-08 NOTE — Transfer of Care (Signed)
Immediate Anesthesia Transfer of Care Note  Patient: Mia Gray  Procedure(s) Performed: EXPLORATORY LAPAROTOMY WITH SMALL BOWEL RESECTION (Abdomen) HERNIA REPAIR VENTRAL ADULT (Abdomen)  Patient Location: PACU  Anesthesia Type:General  Level of Consciousness: drowsy  Airway & Oxygen Therapy: Patient Spontanous Breathing and Patient connected to face mask oxygen  Post-op Assessment: Report given to RN and Post -op Vital signs reviewed and stable  Post vital signs: Reviewed and stable  Last Vitals:  Vitals Value Taken Time  BP 133/83 07/08/23 1530  Temp 37.4 C 07/08/23 1529  Pulse 86 07/08/23 1543  Resp 22 07/08/23 1543  SpO2 100 % 07/08/23 1543  Vitals shown include unfiled device data.  Last Pain:  Vitals:   07/08/23 1529  TempSrc:   PainSc: Asleep      Patients Stated Pain Goal: 0 (07/08/23 1334)  Complications: No notable events documented.

## 2023-07-08 NOTE — ED Notes (Signed)
Patient cannot provide urine sample at this time; MD made aware.

## 2023-07-08 NOTE — ED Notes (Signed)
Patient states Morphine made her pain worse. MD made aware.

## 2023-07-08 NOTE — ED Provider Notes (Signed)
[facility] Provider Note    Event Date/Time   First MD Initiated Contact with Patient 07/08/23 1002     (approximate)   History   Abdominal Pain   HPI  Mia Gray is a 67 y.o. female who presents to the emergency department today because of concerns for abdominal pain.  It started yesterday.  Initially in the epigastric region although it has moved a lower to her stomach.  It is severe she rates it a 20 out of a scale that goes up to 10.  Has been accompanied by some nausea.  Patient denies any change in bladder or bowel habits.  Denies any fevers.  States she had 1 episode of similar pain about a month ago but it was short-lived.     Physical Exam   Triage Vital Signs: ED Triage Vitals  Encounter Vitals Group     BP 07/08/23 0955 117/75     Systolic BP Percentile --      Diastolic BP Percentile --      Pulse Rate 07/08/23 0955 94     Resp 07/08/23 0955 20     Temp 07/08/23 0955 98.3 F (36.8 C)     Temp src --      SpO2 07/08/23 0955 97 %     Weight 07/08/23 0956 150 lb (68 kg)     Height 07/08/23 0956 5\' 1"  (1.549 m)     Head Circumference --      Peak Flow --      Pain Score 07/08/23 0955 7     Pain Loc --      Pain Education --      Exclude from Growth Chart --     Most recent vital signs: Vitals:   07/08/23 0955  BP: 117/75  Pulse: 94  Resp: 20  Temp: 98.3 F (36.8 C)  SpO2: 97%   General: Awake, alert, oriented. CV:  Good peripheral perfusion. Regular rate and rhythm. Resp:  Normal effort. Lungs clear. Abd:  No distention. Tender to palpation in the periumbilical area    ED Results / Procedures / Treatments   Labs (all labs ordered are listed, but only abnormal results are displayed) Labs Reviewed  COMPREHENSIVE METABOLIC PANEL - Abnormal; Notable for the following components:      Result Value   CO2 19 (*)    Glucose, Bld 118 (*)    Calcium 8.4 (*)    All other components within normal limits  CBC -  Abnormal; Notable for the following components:   WBC 11.7 (*)    All other components within normal limits  LIPASE, BLOOD  URINALYSIS, ROUTINE W REFLEX MICROSCOPIC     EKG  None    RADIOLOGY I independently interpreted and visualized the CT abd/pel. My interpretation: small amount of free air Radiology interpretation:  IMPRESSION:  1. Scattered intraperitoneal free air within the mid and upper  abdomen without clear site of perforated hollow viscus.  2. There is central prominent loops of small bowel which upper  limits of normal. This could represent a closed loop obstruction.  3. No evidence of intraperitoneal free fluid which is typically  found with perforated gastric or duodenal ulcer.  4. No evidence of perforated diverticula.  5. No pneumatosis or portal venous gas.  6. Prior hernia repair with fundoplication. Intraperitoneal gas  through the GE junction without clear evidence of breakdown of the  fundoplication.   Lurline Idol, personally discussed these images  and results by phone with the on-call radiologist and used this discussion as part of my medical decision making.    PROCEDURES:  Critical Care performed: Yes  CRITICAL CARE Performed by: Phineas Semen   Total critical care time: 30 minutes  Critical care time was exclusive of separately billable procedures and treating other patients.  Critical care was necessary to treat or prevent imminent or life-threatening deterioration.  Critical care was time spent personally by me on the following activities: development of treatment plan with patient and/or surrogate as well as nursing, discussions with consultants, evaluation of patient's response to treatment, examination of patient, obtaining history from patient or surrogate, ordering and performing treatments and interventions, ordering and review of laboratory studies, ordering and review of radiographic studies, pulse oximetry and re-evaluation  of patient's condition.   Procedures    MEDICATIONS ORDERED IN ED: Medications - No data to display   IMPRESSION / MDM / ASSESSMENT AND PLAN / ED COURSE  I reviewed the triage vital signs and the nursing notes.                              Differential diagnosis includes, but is not limited to, pancreatitis, gastritis, gastroenteritis, aorta pathology  Patient's presentation is most consistent with acute presentation with potential threat to life or bodily function.   The patient is on the cardiac monitor to evaluate for evidence of arrhythmia and/or significant heart rate changes.  Patient presents to the emergency department today because of concerns for abdominal pain.  On exam she is tender in the periumbilical region.  Blood work without any significant leukocytosis, patient is afebrile.  However given significance of pain will obtain CT scan to evaluate.  CT scan is concerning for free intraperitoneal air of unclear etiology.  Will start patient on antibiotics.  Discussed findings with patient.  Discussed with surgery who evaluated patient and will plan on taking to the OR.      FINAL CLINICAL IMPRESSION(S) / ED DIAGNOSES   Final diagnoses:  Abdominal pain, unspecified abdominal location  Free intraperitoneal air       Note:  This document was prepared using Dragon voice recognition software and may include unintentional dictation errors.    Phineas Semen, MD 07/08/23 1256

## 2023-07-08 NOTE — Op Note (Addendum)
PROCEDURES: Laparotomy Small bowel resection w primary stapled anastomosis Repair incarcerated recurrent ventral hernia  Pre-operative Diagnosis:Free air, recurrent ventral hernia  Post-operative Diagnosis: same  Surgeon: Merri Ray Zayon Trulson   Assistants: Laqueta Due [facility]  Anesthesia: General endotracheal anesthesia  ASA Class: 3  Surgeon: Sterling Big , MD FACS  Anesthesia: Gen. 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 The benefits, complications, treatment options, and expected outcomes were discussed with the patient. The risks of bleeding, infection, recurrence of symptoms, failure to resolve symptoms,  bowel injury, any of which could require further surgery were reviewed with the patient.   The patient was taken to Operating Room, identified and the procedure verified.  A Time Out was held and the above information confirmed.  Prior to the induction of general anesthesia, antibiotic prophylaxis was administered. VTE prophylaxis was in place. General endotracheal anesthesia was then administered and tolerated well. After the induction, the abdomen was prepped with Chloraprep and draped in the sterile fashion. The patient was positioned in the supine position.  A laparotomy was performed in standard fashion.  We found a 3 cm chronically incarcerated ventral defect.  The fascia was incised and the abdominal cavity was entered.  There was a piece of chronic incarcerated omentum that was reduced.  After obtaining adequate exposure we explored her abdominal cavity.  She did have significant adhesions from the abdominal wall to the stomach.  Those were  taken down with scissors and electrocautery in the standard fashion.  Attention then was turned to the stomach where there was no evidence of perforation.  We proceeded to identify the ligament of Treitz and run the small bowel. Within the small bowel there was some turbid purulent fluid and At about 40 cm from the ligament of Treitz we found a perforated diverticulum within the jejunum.  She did have at least 3 or 4 large diverticula take lumen within the small bowel and one of them had perforated and there was spillage of bile.  We were able to control the spillage of bile and perform a small bowel resection after dividing the bowel proximally and distally with good healthy margins.  75 GIA stapler was used to perform the small bowel resection.  A side-to-side functional end-to-end anastomosis was performed using a GIA stapler and the common channel was closed also with a stapler after elevating the defect between Allis clamps. The small bowel mesentery was closed with a running 3-0 Vicryl.  Please note that we tested the anastomosis and there was no evidence of any bile leak the perfusion was good and it was widely patent. We continue exploration of the abdomen and found no other evidence of perforation anywhere.  The sigmoid colon had multiple diverticuli without evidence of perforation. Abdominal cavity was irrigated w irricept. Seprafilm was used to prevent adhesions.  The fascia was closed using 0 PDS in a continuous fashion using a small bite technique and we incorporated the ventral hernia defect along the closure. Skin Was closed with staples. Sterile dressing placed.  Liposomal Marcaine  was injected under direct visualization to perform bilateral TAP block.  Needle and laparotomy count were correct and there were no immediate occasions Note that Mr. Manus Rudd [facility] was essential during the operation for exposure creation of anastomosis and closure Sterling Big, MD, FACS

## 2023-07-08 NOTE — Anesthesia Procedure Notes (Signed)
Procedure Name: Intubation Date/Time: 07/08/2023 2:01 PM  Performed by: Malva Cogan, CRNAPre-anesthesia Checklist: Patient identified, Patient being monitored, Timeout performed, Emergency Drugs available and Suction available Patient Re-evaluated:Patient Re-evaluated prior to induction Oxygen Delivery Method: Circle system utilized Preoxygenation: Pre-oxygenation with 100% oxygen Induction Type: IV induction Ventilation: Mask ventilation without difficulty Laryngoscope Size: 3 and McGraph Grade View: Grade I Tube type: Oral Tube size: 6.5 mm Number of attempts: 1 Airway Equipment and Method: Stylet Placement Confirmation: ETT inserted through vocal cords under direct vision, positive ETCO2 and breath sounds checked- equal and bilateral Secured at: 21 cm Tube secured with: Tape Dental Injury: Teeth and Oropharynx as per pre-operative assessment

## 2023-07-08 NOTE — Plan of Care (Signed)
Mia Gray

## 2023-07-08 NOTE — H&P (Signed)
[facility] SURGICAL HISTORY & PHYSICAL (cpt [phone])  HISTORY OF PRESENT ILLNESS (HPI):  67 y.o. female presented to [facility] ED today for abdominal pain. Patient reports the acute onset of severe abdominal pain, centrally, yesterday afternoon. This was severe in nature. She took tylenol with minimal relief put the pain persisted throughout the night. She has felt nauseous. No fever, chills, emesis, or bowel changes. She has reported continuing to pass gas. She does report a previous history of ulcers; not taking any BC powders, NSAIDs. She does have a significant previous surgical history including multiple hiatal hernia repairs, bilateral inguinal hernia repairs, cholecystectomy. Work up in the ED revealed a mild leukocytosis to 11.7K, Hgb 14.6, sCr - 0.95, no electrolyte derangements, lipase normal at 24. CT Abdomen/pelvis was concerning for pneumoperitoneum, no clear source.   General surgery is consulted by emergency medicine physician Dr Olga Coaster, MD for evaluation and management of pneumoperitoneum.  PAST MEDICAL HISTORY (PMH):  Past Medical History:  Diagnosis Date   Actinic keratosis    Bronchitis    Hiatal hernia    Hypertension    Melanoma (HCC) 10/21/2017   Right posterior thigh. MM, tumor thickness 0.73mm, Anatomic level III. Excised: 01/06/2018, margins free.   Migraine     Reviewed. Otherwise negative.   PAST SURGICAL HISTORY [facility]):  Past Surgical History:  Procedure Laterality Date   BREAST BIOPSY Right 10/15/2016   CALCIFICATIONS WITHIN SCATTERED MICROCYSTS AND INVOLUTED LOBULES.   BREAST CYST ASPIRATION Right 10+ yrs ago   negative   BREAST SURGERY Right    lumpectomy   CHOLECYSTECTOMY     [facility]   COLONOSCOPY WITH PROPOFOL N/A 07/15/2017   Procedure: COLONOSCOPY WITH PROPOFOL;  Surgeon: Christena Deem, MD;  Location: [facility] ENDOSCOPY;  Service: Endoscopy;  Laterality: N/A;   COLONOSCOPY WITH PROPOFOL N/A 05/29/2022   Procedure: COLONOSCOPY WITH  PROPOFOL;  Surgeon: Toledo, Boykin Nearing, MD;  Location: [facility] ENDOSCOPY;  Service: Gastroenterology;  Laterality: N/A;   ESOPHAGOGASTRODUODENOSCOPY (EGD) WITH PROPOFOL N/A 07/15/2017   Procedure: ESOPHAGOGASTRODUODENOSCOPY (EGD) WITH PROPOFOL;  Surgeon: Christena Deem, MD;  Location: [facility] ENDOSCOPY;  Service: Endoscopy;  Laterality: N/A;   haital hernia     HERNIA REPAIR Right    x 2   HERNIA REPAIR Left    [place]    Reviewed. Otherwise negative.   MEDICATIONS:  Prior to Admission medications   Medication Sig Start Date End Date Taking? Authorizing Provider  acetaminophen (TYLENOL) 325 MG tablet Take 2 tablets (650 mg total) by mouth 2 (two) times daily as needed for moderate pain or fever. Patient not taking: Reported on 09/04/2022 06/12/15   Altamese Dilling, MD  cetirizine (ZYRTEC) 10 MG tablet Take 10 mg by mouth daily as needed for allergies.    [provider]  Cholecalciferol (VITAMIN D-1000 MAX ST) 25 MCG (1000 UT) tablet Take by mouth.    [provider]  CREON 36000-114000 units CPEP capsule Take 36,000 Units by mouth 3 (three) times daily.    [provider]  cyanocobalamin (VITAMIN B12) 1000 MCG tablet Take by mouth. 06/02/19   [provider]  GARLIC PO Take by mouth.    [provider]  ketoconazole (NIZORAL) 2 % shampoo Shampoo into scalp let sit 5-10 minutes then wash out. Use 3 days per week. 08/29/21   Deirdre Evener, MD  lisinopril (ZESTRIL) 10 MG tablet Take 5 mg by mouth daily.    [provider]  metoprolol tartrate (LOPRESSOR) 25 MG tablet  Take 25 mg by mouth daily.    [provider]  mometasone (ELOCON) 0.1 % lotion Apply to itchy areas of scalp qd up to 5 days per week prn itching 08/29/21   Deirdre Evener, MD  ondansetron (ZOFRAN-ODT) 4 MG disintegrating tablet Take 4 mg by mouth every 8 (eight) hours as needed for nausea or vomiting.    [provider]  SUMAtriptan (IMITREX) 100  MG tablet TAKE 1 TABLET ONCE DAILY AS NEEDED FOR MIGRAINE HEADACHE.  MAY TAKE A SECOND DOSE AFTER 2 HOURS IF NEEDED 08/19/22   [provider]  topiramate (TOPAMAX) 100 MG tablet Take 100 mg by mouth 2 (two) times daily.    [provider]     ALLERGIES:  Allergies  Allergen Reactions   Hydrocodone Nausea And Vomiting     SOCIAL HISTORY:  Social History   Socioeconomic History   Marital status: Legally Separated    Spouse name: Not on file   Number of children: Not on file   Years of education: Not on file   Highest education level: Not on file  Occupational History   Not on file  Tobacco Use   Smoking status: Never   Smokeless tobacco: Never  Vaping Use   Vaping status: Never Used  Substance and Sexual Activity   Alcohol use: No   Drug use: No   Sexual activity: Not on file  Other Topics Concern   Not on file  Social History Narrative   Not on file   Social Determinants of Health   Financial Resource Strain: Not on file  Food Insecurity: Not on file  Transportation Needs: Not on file  Physical Activity: Not on file  Stress: Not on file  Social Connections: Not on file  Intimate Partner Violence: Not on file     FAMILY HISTORY:  Family History  Problem Relation Age of Onset   Cancer Mother        breast and colon   Stroke Mother    Breast cancer Mother     Otherwise negative.   REVIEW OF SYSTEMS:  Review of Systems  Constitutional:  Positive for chills. Negative for fever.  Respiratory:  Negative for cough and shortness of breath.   Cardiovascular:  Negative for chest pain and palpitations.  Gastrointestinal:  Positive for abdominal pain and nausea. Negative for constipation, diarrhea and vomiting.  All other systems reviewed and are negative.   VITAL SIGNS:  Temp:  [98.3 F (36.8 C)] 98.3 F (36.8 C) (10/15 0955) Pulse Rate:  [76-94] 76 (10/15 1110) Resp:  [20] 20 (10/15 0955) BP: (117-122)/(75-93) 122/93 (10/15 1110) SpO2:   [97 %-98 %] 98 % (10/15 1110) Weight:  [68 kg] 68 kg (10/15 0956)     Height: 5\' 1"  (154.9 cm) Weight: 68 kg BMI (Calculated): 28.36   PHYSICAL EXAM:  Physical Exam Vitals and nursing note reviewed. Exam conducted with a chaperone present.  Constitutional:      General: She is not in acute distress.    Appearance: She is well-developed. She is not ill-appearing.     Comments: Resting in bed; family at bedside   Eyes:     General: No scleral icterus.    Extraocular Movements: Extraocular movements intact.  Cardiovascular:     Rate and Rhythm: Normal rate.     Heart sounds: Normal heart sounds. No murmur heard. Pulmonary:     Effort: Pulmonary effort is normal. No respiratory distress.  Abdominal:     General:  A surgical scar is present. There is no distension.     Palpations: Abdomen is soft.     Tenderness: There is abdominal tenderness in the epigastric area and periumbilical area. There is no guarding or rebound.     Comments: Abdomen is soft, she is markedly tender in the epigastrium and periumbilically, non-distended   Genitourinary:    Comments: Deferred Skin:    General: Skin is warm and dry.     Coloration: Skin is not jaundiced.  Neurological:     General: No focal deficit present.     Mental Status: She is alert and oriented to person, place, and time.  Psychiatric:        Mood and Affect: Mood normal.        Behavior: Behavior normal.     INTAKE/OUTPUT:  This shift: No intake/output data recorded.  Last 2 shifts: @IOLAST2SHIFTS @  Labs:     Latest Ref Rng & Units 07/08/2023    9:57 AM 03/24/2019   12:00 PM 07/31/2018    7:30 PM  CBC  WBC 4.0 - 10.5 K/uL 11.7  9.3  8.5   Hemoglobin 12.0 - 15.0 g/dL 16.1  09.6  04.5   Hematocrit 36.0 - 46.0 % 41.8  41.6  41.8   Platelets 150 - 400 K/uL 256  273  305       Latest Ref Rng & Units 07/08/2023    9:57 AM 06/07/2019    7:01 PM 03/24/2019   12:00 PM  CMP  Glucose 70 - 99 mg/dL 409   811   BUN 8 - 23 mg/dL 14   17    Creatinine 9.14 - 1.00 mg/dL 7.82  9.56  2.13   Sodium 135 - 145 mmol/L 137   138   Potassium 3.5 - 5.1 mmol/L 4.2   4.5   Chloride 98 - 111 mmol/L 108   105   CO2 22 - 32 mmol/L 19   22   Calcium 8.9 - 10.3 mg/dL 8.4   8.8   Total Protein 6.5 - 8.1 g/dL 7.2     Total Bilirubin 0.3 - 1.2 mg/dL 1.1     Alkaline Phos 38 - 126 U/L 64     AST 15 - 41 U/L 23     ALT 0 - 44 U/L 15        Imaging studies:   CT Abdomen/Pelvis (07/08/2023) personally reviewed with pneumoperitoneum, no obvious source, and radiologist report reviewed below:   IMPRESSION: 1. Scattered intraperitoneal free air within the mid and upper abdomen without clear site of perforated hollow viscus. 2. There is central prominent loops of small bowel which upper limits of normal. This could represent a closed loop obstruction. 3. No evidence of intraperitoneal free fluid which is typically found with perforated gastric or duodenal ulcer. 4. No evidence of perforated diverticula. 5. No pneumatosis or portal venous gas. 6. Prior hernia repair with fundoplication. Intraperitoneal gas through the GE junction without clear evidence of breakdown of the fundoplication.   Assessment/Plan: 67 y.o. female with pneumoperitoneum.    - Given pneumoperitoneum and abdominal tenderness, will plan to proceed with exploratory laparotomy. She understands the source remains unclear, although favor stomach vs small bowel. All risks, benefits, and alternatives to above procedure(s) were discussed with the patient and her family, all of their questions were answered to their expressed satisfaction, patient expresses she wishes to proceed, and informed consent was obtained.     - NPO + IVF Support -  IV Abx (Zosyn) - PPI - Monitor abdominal examination; on-going bowel function - Pain control prn; antiemetics prn    - Monitor leukocytosis   All of the above findings and recommendations were discussed with the patient and her family, and  all of their questions were answered to their expressed satisfaction.  -- Lynden Oxford, PA-C [facility] 07/08/2023, 12:44 PM M-F: 7am - 4pm

## 2023-07-08 NOTE — Plan of Care (Signed)

## 2023-07-08 NOTE — Anesthesia Preprocedure Evaluation (Addendum)
Anesthesia Evaluation  Patient identified by MRN, date of birth, ID band Patient awake    Reviewed: Allergy & Precautions, NPO status , Patient's Chart, lab work & pertinent test results  History of Anesthesia Complications Negative for: history of anesthetic complications  Airway Mallampati: III  TM Distance: >3 FB Neck ROM: full    Dental  (+) Chipped, Poor Dentition, Missing   Pulmonary neg pulmonary ROS   Pulmonary exam normal        Cardiovascular hypertension, On Medications and On Home Beta Blockers Normal cardiovascular exam     Neuro/Psych  Headaches  negative psych ROS   GI/Hepatic Neg liver ROS, hiatal hernia,,,  Endo/Other  negative endocrine ROS    Renal/GU      Musculoskeletal   Abdominal   Peds  Hematology negative hematology ROS (+)   Anesthesia Other Findings Past Medical History: No date: Actinic keratosis No date: Bronchitis No date: Hiatal hernia No date: Hypertension 10/21/2017: Melanoma (HCC)     Comment:  Right posterior thigh. MM, tumor thickness 0.64mm,               Anatomic level III. Excised: 01/06/2018, margins free. No date: Migraine  Past Surgical History: 10/15/2016: BREAST BIOPSY; Right     Comment:  CALCIFICATIONS WITHIN SCATTERED MICROCYSTS AND INVOLUTED              LOBULES. 10+ yrs ago: BREAST CYST ASPIRATION; Right     Comment:  negative No date: BREAST SURGERY; Right     Comment:  lumpectomy No date: CHOLECYSTECTOMY     Comment:  [facility] 07/15/2017: COLONOSCOPY WITH PROPOFOL; N/A     Comment:  Procedure: COLONOSCOPY WITH PROPOFOL;  Surgeon:               Christena Deem, MD;  Location: [facility] ENDOSCOPY;                Service: Endoscopy;  Laterality: N/A; 05/29/2022: COLONOSCOPY WITH PROPOFOL; N/A     Comment:  Procedure: COLONOSCOPY WITH PROPOFOL;  Surgeon: Toledo,               Boykin Nearing, MD;  Location: [facility] ENDOSCOPY;  Service:               Gastroenterology;   Laterality: N/A; 07/15/2017: ESOPHAGOGASTRODUODENOSCOPY (EGD) WITH PROPOFOL; N/A     Comment:  Procedure: ESOPHAGOGASTRODUODENOSCOPY (EGD) WITH               PROPOFOL;  Surgeon: Christena Deem, MD;  Location:               [facility] ENDOSCOPY;  Service: Endoscopy;  Laterality: N/A; No date: haital hernia No date: HERNIA REPAIR; Right     Comment:  x 2 No date: HERNIA REPAIR; Left     Comment:  [place]  BMI    Body Mass Index: 28.34 kg/m      Reproductive/Obstetrics negative OB ROS                             Anesthesia Physical Anesthesia Plan  ASA: 2 and emergent  Anesthesia Plan: General ETT   Post-op Pain Management: Ofirmev IV (intra-op)*, Toradol IV (intra-op)* and Ketamine IV*   Induction: Intravenous and Rapid sequence  PONV Risk Score and Plan: Ondansetron, Dexamethasone, Midazolam and Treatment may vary due to age or medical condition  Airway Management Planned: Oral ETT  Additional Equipment:   Intra-op Plan:  Post-operative Plan: Extubation in OR  Informed Consent: I have reviewed the patients History and Physical, chart, labs and discussed the procedure including the risks, benefits and alternatives for the proposed anesthesia with the patient or authorized representative who has indicated his/her understanding and acceptance.     Dental Advisory Given  Plan Discussed with: Anesthesiologist, CRNA and Surgeon  Anesthesia Plan Comments: (Patient consented for risks of anesthesia including but not limited to:  - adverse reactions to medications - damage to eyes, teeth, lips or other oral mucosa - nerve damage due to positioning  - sore throat or hoarseness - Damage to heart, brain, nerves, lungs, other parts of body or loss of life  Patient voiced understanding and assent.)       Anesthesia Quick Evaluation

## 2023-07-09 ENCOUNTER — Encounter: Payer: Self-pay | Admitting: Surgery

## 2023-07-09 LAB — CBC
HCT: 38.4 % (ref 36.0–46.0)
Hemoglobin: 13.2 g/dL (ref 12.0–15.0)
MCH: 30.4 pg (ref 26.0–34.0)
MCHC: 34.4 g/dL (ref 30.0–36.0)
MCV: 88.5 fL (ref 80.0–100.0)
Platelets: 228 10*3/uL (ref 150–400)
RBC: 4.34 MIL/uL (ref 3.87–5.11)
RDW: 12.9 % (ref 11.5–15.5)
WBC: 10.6 10*3/uL — ABNORMAL HIGH (ref 4.0–10.5)
nRBC: 0 % (ref 0.0–0.2)

## 2023-07-09 LAB — COMPREHENSIVE METABOLIC PANEL
ALT: 21 U/L (ref 0–44)
AST: 26 U/L (ref 15–41)
Albumin: 3.3 g/dL — ABNORMAL LOW (ref 3.5–5.0)
Alkaline Phosphatase: 55 U/L (ref 38–126)
Anion gap: 10 (ref 5–15)
BUN: 12 mg/dL (ref 8–23)
CO2: 18 mmol/L — ABNORMAL LOW (ref 22–32)
Calcium: 7.8 mg/dL — ABNORMAL LOW (ref 8.9–10.3)
Chloride: 109 mmol/L (ref 98–111)
Creatinine, Ser: 0.87 mg/dL (ref 0.44–1.00)
GFR, Estimated: >60 mL/min (ref >60)
Glucose, Bld: 128 mg/dL — ABNORMAL HIGH (ref 70–99)
Potassium: 4.6 mmol/L (ref 3.5–5.1)
Sodium: 137 mmol/L (ref 135–145)
Total Bilirubin: 0.9 mg/dL (ref 0.3–1.2)
Total Protein: 6.5 g/dL (ref 6.5–8.1)

## 2023-07-09 LAB — MAGNESIUM: Magnesium: 2 mg/dL (ref 1.7–2.4)

## 2023-07-09 LAB — PHOSPHORUS: Phosphorus: 2.6 mg/dL (ref 2.5–4.6)

## 2023-07-09 MED ORDER — ALBUMIN HUMAN 25 % IV SOLN
25.0000 g | Freq: Once | INTRAVENOUS | Status: AC
  Administered 2023-07-09: 25 g via INTRAVENOUS
  Filled 2023-07-09: qty 100

## 2023-07-09 MED ORDER — TOPIRAMATE 100 MG PO TABS
100.0000 mg | ORAL_TABLET | Freq: Two times a day (BID) | ORAL | Status: DC
  Administered 2023-07-09 – 2023-07-14 (×12): 100 mg via ORAL
  Filled 2023-07-09 (×13): qty 1

## 2023-07-09 MED ORDER — PROCHLORPERAZINE EDISYLATE 10 MG/2ML IJ SOLN
10.0000 mg | Freq: Four times a day (QID) | INTRAMUSCULAR | Status: DC | PRN
  Administered 2023-07-09 – 2023-07-10 (×3): 10 mg via INTRAVENOUS
  Filled 2023-07-09 (×3): qty 2

## 2023-07-09 MED ORDER — LISINOPRIL 10 MG PO TABS
5.0000 mg | ORAL_TABLET | Freq: Every day | ORAL | Status: DC
  Administered 2023-07-09 – 2023-07-15 (×7): 5 mg via ORAL
  Filled 2023-07-09 (×7): qty 1

## 2023-07-09 MED ORDER — METOPROLOL SUCCINATE ER 25 MG PO TB24
25.0000 mg | ORAL_TABLET | Freq: Every day | ORAL | Status: DC
  Administered 2023-07-09 – 2023-07-15 (×7): 25 mg via ORAL
  Filled 2023-07-09 (×7): qty 1

## 2023-07-09 NOTE — Progress Notes (Addendum)
[facility] SURGICAL PROGRESS NOTE  Hospital Day(s): 1.   Post op day(s): 1 Day Post-Op.   Interval History:  Patient seen and examined No acute events or new complaints overnight.  Patient reports she is feeling better; still with abdominal soreness expectedly Mild nausea last night; improved this AM No  Mild leukocytosis; 10.6K (improved from 11.7K) Hgb to 13.2 Renal function normal; sCr - 0.87; UO - 400 ccs No electrolyte derangements She is on FLD; tolerating small amounts No reports of flatus   Vital signs in last 24 hours: [min-max] current  Temp:  [97.7 F (36.5 C)-99.4 F (37.4 C)] 98.2 F (36.8 C) (10/16 0736) Pulse Rate:  [70-102] 74 (10/16 0736) Resp:  [12-20] 16 (10/16 0736) BP: (117-154)/(63-93) 131/63 (10/16 0736) SpO2:  [94 %-100 %] 98 % (10/16 0736) Weight:  [68 kg] 68 kg (10/15 1334)     Height: 5\' 1"  (154.9 cm) Weight: 68 kg BMI (Calculated): 28.36   Intake/Output last 2 shifts:  10/15 0701 - 10/16 0700 In: 2156.7 [I.V.:2055; IV Piggyback:101.7] Out: 430 [Urine:400; Blood:30]   Physical Exam:  Constitutional: alert, cooperative and no distress  Respiratory: breathing non-labored at rest  Cardiovascular: regular rate and sinus rhythm  Gastrointestinal: soft, incisional soreness expectedly, and non-distended. No rebound/guarding Integumentary: Laparotomy is CDI with staples; serosanguinous drainage on honeycomb, no erythema   Labs:     Latest Ref Rng & Units 07/09/2023    6:12 AM 07/08/2023    9:57 AM 03/24/2019   12:00 PM  CBC  WBC 4.0 - 10.5 K/uL 10.6  11.7  9.3   Hemoglobin 12.0 - 15.0 g/dL 16.1  09.6  04.5   Hematocrit 36.0 - 46.0 % 38.4  41.8  41.6   Platelets 150 - 400 K/uL 228  256  273       Latest Ref Rng & Units 07/09/2023    6:12 AM 07/08/2023    9:57 AM 06/07/2019    7:01 PM  CMP  Glucose 70 - 99 mg/dL 409  811    BUN 8 - 23 mg/dL 12  14    Creatinine 9.14 - 1.00 mg/dL 7.82  9.56  2.13   Sodium 135 - 145 mmol/L  137  137    Potassium 3.5 - 5.1 mmol/L 4.6  4.2    Chloride 98 - 111 mmol/L 109  108    CO2 22 - 32 mmol/L 18  19    Calcium 8.9 - 10.3 mg/dL 7.8  8.4    Total Protein 6.5 - 8.1 g/dL 6.5  7.2    Total Bilirubin 0.3 - 1.2 mg/dL 0.9  1.1    Alkaline Phos 38 - 126 U/L 55  64    AST 15 - 41 U/L 26  23    ALT 0 - 44 U/L 21  15       Imaging studies: No new pertinent imaging studies   Assessment/Plan:  67 y.o. 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  -- Lynden Oxford, PA-C [facility] 07/09/2023, 7:45 AM M-F: 7am - 4pm

## 2023-07-09 NOTE — TOC Progression Note (Signed)
Transition of Care [facility]) - Progression Note    Patient Details  Name: Mia Gray MRN: [id number] Date of Birth: [birth year]  Transition of Care [facility]) CM/SW Contact  Truddie Hidden, RN Phone Number: 07/09/2023, 11:40 AM  Clinical Narrative:     TOC continuing to follow patient's progress throughout discharge planning.        Expected Discharge Plan and Services                                               Social Determinants of Health (SDOH) Interventions SDOH Screenings   Food Insecurity: No Food Insecurity (07/08/2023)  Housing: Low Risk  (07/08/2023)  Transportation Needs: No Transportation Needs (07/08/2023)  Utilities: Not At Risk (07/08/2023)  Tobacco Use: Low Risk  (07/08/2023)    Readmission Risk Interventions     No data to display

## 2023-07-09 NOTE — Evaluation (Signed)
Physical Therapy Evaluation Patient Details Name: Mia Gray MRN: [id number] DOB: 1956/09/12 Today's Date: 07/09/2023  History of Present Illness  Patient is a 67 y.o. female presented to [facility] ED for abdominal pain. Current MD assessment: pneumoperitoneum; is now s/p: laparotomy, small bowel resection, and ventral hernia repair.  Clinical Impression  Pt was pleasant and motivated to participate during the session and put forth good effort throughout. Pt is supervision for bed mobility, with cues and education on log roll technique. Once seated EOB pt able to come to stand with RW and Min A. Once up pt able to perform short 35 foot amb bout with RW and CGA. Pt taking steady steps with slightly narrow BOS, no imbalance or SOB noted but pt reports lightheadedness, HR and SPO2 were WFL. Pt reports pain slightly increased with mobility but had been medicated during session. Pt will benefit from continued PT services upon discharge to safely address deficits listed in patient problem list for decreased caregiver assistance and eventual return to PLOF.          If plan is discharge home, recommend the following: A little help with walking and/or transfers;A little help with bathing/dressing/bathroom;Assist for transportation;Help with stairs or ramp for entrance   Can travel by private vehicle   Yes    Equipment Recommendations Other (comment) (TBD at next venue of care)  Recommendations for Other Services       Functional Status Assessment Patient has had a recent decline in their functional status and demonstrates the ability to make significant improvements in function in a reasonable and predictable amount of time.     Precautions / Restrictions Precautions Precautions: Fall Precaution Comments: Abdominal; Log roll Restrictions Weight Bearing Restrictions: No      Mobility  Bed Mobility Overal bed mobility: Needs Assistance Bed Mobility: Rolling, Sidelying to Sit Rolling:  Supervision Sidelying to sit: Used rails, Supervision       General bed mobility comments: VC's for log roll, pt able to perform slowly    Transfers Overall transfer level: Needs assistance Equipment used: Rolling walker (2 wheels) Transfers: Sit to/from Stand Sit to Stand: Min assist           General transfer comment: pt needing to gain some momentum by rocking forwards/backwards but ultimately needing very light Min A to come fully upright, VC's for hand placement    Ambulation/Gait Ambulation/Gait assistance: Contact guard assist Gait Distance (Feet): 35 Feet Assistive device: Rolling walker (2 wheels) Gait Pattern/deviations: Decreased step length - right, Decreased step length - left, Decreased stride length, Narrow base of support, Step-through pattern Gait velocity: decreased     General Gait Details: slow, careful steps with no imbalance, pt concerns of slight lightheadedness but noted no imbalance; HR and SpO2 remained WFL.  Stairs            Wheelchair Mobility     Tilt Bed    Modified Rankin (Stroke Patients Only)       Balance Overall balance assessment: No apparent balance deficits (not formally assessed)                                           Pertinent Vitals/Pain Pain Assessment Pain Assessment: 0-10 Pain Score: 7  Pain Location: Abdomen Pain Descriptors / Indicators: Sore, Guarding Pain Intervention(s): RN gave pain meds during session, Monitored during session, Limited activity within patient's tolerance  Home Living Family/patient expects to be discharged to:: Private residence Living Arrangements: Alone Available Help at Discharge: Neighbor (No neighbors can provide physical assist. Use sure if family will help) Type of Home: House Home Access: Level entry       Home Layout: Two level;Able to live on main level with bedroom/bathroom Home Equipment: Rolling Walker (2 wheels);Cane - single point;Grab bars  - toilet;Grab bars - tub/shower      Prior Function Prior Level of Function : History of Falls (last six months);Independent/Modified Independent             Mobility Comments: Limited community ambulator without AD, pt endorses 1x fall where she got tripped up and landed on her knees, but overall fine now. Driving, ADLs Comments: Ind     Extremity/Trunk Assessment   Upper Extremity Assessment Upper Extremity Assessment: Overall WFL for tasks assessed    Lower Extremity Assessment Lower Extremity Assessment: Generalized weakness       Communication   Communication Communication: No apparent difficulties  Cognition Arousal: Alert Behavior During Therapy: WFL for tasks assessed/performed Overall Cognitive Status: Within Functional Limits for tasks assessed                                          General Comments      Exercises Other Exercises Other Exercises: Education on log roll technique and other pain management strategies when performing mobility   Assessment/Plan    PT Assessment Patient needs continued PT services  PT Problem List Decreased strength;Decreased coordination;Decreased range of motion;Decreased activity tolerance;Decreased balance;Decreased mobility       PT Treatment Interventions DME instruction;Balance training;Gait training;Stair training;Functional mobility training;Therapeutic activities;Therapeutic exercise    PT Goals (Current goals can be found in the Care Plan section)  Acute Rehab PT Goals Patient Stated Goal: walk without pain PT Goal Formulation: With patient Time For Goal Achievement: 07/22/23 Potential to Achieve Goals: Good    Frequency Min 1X/week     Co-evaluation               AM-PAC PT "6 Clicks" Mobility  Outcome Measure Help needed turning from your back to your side while in a flat bed without using bedrails?: A Little Help needed moving from lying on your back to sitting on the side of  a flat bed without using bedrails?: A Little Help needed moving to and from a bed to a chair (including a wheelchair)?: A Little Help needed standing up from a chair using your arms (e.g., wheelchair or bedside chair)?: A Little Help needed to walk in hospital room?: A Little Help needed climbing 3-5 steps with a railing? : A Little 6 Click Score: 18    End of Session Equipment Utilized During Treatment: Gait belt Activity Tolerance: Patient tolerated treatment well Patient left: in chair;with call bell/phone within reach;with chair alarm set Nurse Communication: Mobility status PT Visit Diagnosis: Other abnormalities of gait and mobility (R26.89);Difficulty in walking, not elsewhere classified (R26.2);Pain Pain - Right/Left:  (midline) Pain - part of body:  (abdomen)    Time: 0981-1914 PT Time Calculation (min) (ACUTE ONLY): 41 min   Charges:                 Cecile Sheerer, SPT 07/09/23, 4:05 PM

## 2023-07-10 LAB — CBC
HCT: 32.2 % — ABNORMAL LOW (ref 36.0–46.0)
Hemoglobin: 11.5 g/dL — ABNORMAL LOW (ref 12.0–15.0)
MCH: 31.1 pg (ref 26.0–34.0)
MCHC: 35.7 g/dL (ref 30.0–36.0)
MCV: 87 fL (ref 80.0–100.0)
Platelets: 206 10*3/uL (ref 150–400)
RBC: 3.7 MIL/uL — ABNORMAL LOW (ref 3.87–5.11)
RDW: 12.9 % (ref 11.5–15.5)
WBC: 9.7 10*3/uL (ref 4.0–10.5)
nRBC: 0 % (ref 0.0–0.2)

## 2023-07-10 LAB — BASIC METABOLIC PANEL
Anion gap: 6 (ref 5–15)
BUN: 9 mg/dL (ref 8–23)
CO2: 18 mmol/L — ABNORMAL LOW (ref 22–32)
Calcium: 7.9 mg/dL — ABNORMAL LOW (ref 8.9–10.3)
Chloride: 112 mmol/L — ABNORMAL HIGH (ref 98–111)
Creatinine, Ser: 0.82 mg/dL (ref 0.44–1.00)
GFR, Estimated: >60 mL/min (ref >60)
Glucose, Bld: 110 mg/dL — ABNORMAL HIGH (ref 70–99)
Potassium: 3.7 mmol/L (ref 3.5–5.1)
Sodium: 136 mmol/L (ref 135–145)

## 2023-07-10 MED ORDER — ENOXAPARIN SODIUM 40 MG/0.4ML IJ SOSY
40.0000 mg | PREFILLED_SYRINGE | INTRAMUSCULAR | Status: DC
  Administered 2023-07-10 – 2023-07-15 (×5): 40 mg via SUBCUTANEOUS
  Filled 2023-07-10 (×6): qty 0.4

## 2023-07-10 MED ORDER — CALCIUM CARBONATE ANTACID 500 MG PO CHEW
1.0000 | CHEWABLE_TABLET | Freq: Two times a day (BID) | ORAL | Status: DC
  Administered 2023-07-10 – 2023-07-15 (×10): 200 mg via ORAL
  Filled 2023-07-10 (×10): qty 1

## 2023-07-10 NOTE — Plan of Care (Signed)

## 2023-07-10 NOTE — Progress Notes (Signed)
[facility] SURGICAL PROGRESS NOTE  Hospital Day(s): 2.   Post op day(s): 2 Days Post-Op.   Interval History:  Patient seen and examined No acute events or new complaints overnight.  Patient reports she is feeling better this morning Abdominal soreness is improving Intermittent nausea No fever, chills, emesis  Leukocytosis resolved; 9.7K  Hgb to 11.5; dilutional  Renal function normal; sCr - 0.82; UO - 1300 ccs No electrolyte derangements She is on FLD; tolerating No reports of flatus; no BM  Vital signs in last 24 hours: [min-max] current  Temp:  [98.3 F (36.8 C)-98.8 F (37.1 C)] 98.3 F (36.8 C) (10/17 0354) Pulse Rate:  [71-88] 71 (10/17 0354) Resp:  [16-18] 16 (10/17 0354) BP: (124-148)/(77-87) 124/77 (10/17 0354) SpO2:  [98 %-99 %] 98 % (10/17 0354)     Height: 5\' 1"  (154.9 cm) Weight: 68 kg BMI (Calculated): 28.36   Intake/Output last 2 shifts:  10/16 0701 - 10/17 0700 In: 700 [P.O.:700] Out: 1300 [Urine:1300]   Physical Exam:  Constitutional: alert, cooperative and no distress  Respiratory: breathing non-labored at rest  Cardiovascular: regular rate and sinus rhythm  Gastrointestinal: soft, incisional soreness expectedly, and non-distended. No rebound/guarding Integumentary: Laparotomy is CDI with staples; serosanguinous drainage on honeycomb, no erythema   Labs:     Latest Ref Rng & Units 07/10/2023    5:15 AM 07/09/2023    6:12 AM 07/08/2023    9:57 AM  CBC  WBC 4.0 - 10.5 K/uL 9.7  10.6  11.7   Hemoglobin 12.0 - 15.0 g/dL 16.1  09.6  04.5   Hematocrit 36.0 - 46.0 % 32.2  38.4  41.8   Platelets 150 - 400 K/uL 206  228  256       Latest Ref Rng & Units 07/10/2023    5:15 AM 07/09/2023    6:12 AM 07/08/2023    9:57 AM  CMP  Glucose 70 - 99 mg/dL 409  811  914   BUN 8 - 23 mg/dL 9  12  14    Creatinine 0.44 - 1.00 mg/dL 7.82  9.56  2.13   Sodium 135 - 145 mmol/L 136  137  137   Potassium 3.5 - 5.1 mmol/L 3.7  4.6  4.2    Chloride 98 - 111 mmol/L 112  109  108   CO2 22 - 32 mmol/L 18  18  19    Calcium 8.9 - 10.3 mg/dL 7.9  7.8  8.4   Total Protein 6.5 - 8.1 g/dL  6.5  7.2   Total Bilirubin 0.3 - 1.2 mg/dL  0.9  1.1   Alkaline Phos 38 - 126 U/L  55  64   AST 15 - 41 U/L  26  23   ALT 0 - 44 U/L  21  15      Imaging studies: No new pertinent imaging studies   Assessment/Plan:  67 y.o. 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 Orders placed   - Continue IV abx (Zosyn) for now  - Monitor abdominal examination; on-going bowel function  - Pain control prn; antiemetics prn  - Monitor leukocytosis; resolved  - Mobilize; PT following - did well   - Discharge Planning: Doing well, awaiting return of bowel function and diet advancement. Potentially home in next 24-48  hours   All of the above findings and recommendations were discussed with the patient, patient's family at bedside, and the medical team, and all of patient's and family's questions were answered to their expressed satisfaction.  -- Lynden Oxford, PA-C [facility] 07/10/2023, 7:38 AM M-F: 7am - 4pm

## 2023-07-10 NOTE — Plan of Care (Signed)

## 2023-07-10 NOTE — Progress Notes (Signed)
Physical Therapy Treatment Patient Details Name: Mia Gray MRN: [id number] DOB: 1956-03-17 Today's Date: 07/10/2023   History of Present Illness Patient is a 67 y.o. female presented to [facility] ED for abdominal pain. Current MD assessment: pneumoperitoneum; is now s/p: laparotomy, small bowel resection, and ventral hernia repair.    PT Comments  Pt is received in bed with family/friend at bedside, she is agreeable to PT session. Pt performs bed mobility and transfers supA, and amb CGA. Pt able to perform peri-care mod I with close supA for safety with no notable LOB during stance. Additionally, Pt able to amb approx 110 ft using RW with no increase abdominal pain reported. Overall, Pt demonstrates steady progression towards PT goals as seen by increase amb distance and assistance required but would benefit from cont skilled PT to address above deficits and promote optimal return to PLOF. Communicated with care team regarding change of discharge disposition via secure chat.   If plan is discharge home, recommend the following: A little help with walking and/or transfers;A little help with bathing/dressing/bathroom;Assist for transportation;Help with stairs or ramp for entrance   Can travel by private vehicle     Yes  Equipment Recommendations  Rolling walker (2 wheels)    Recommendations for Other Services       Precautions / Restrictions Precautions Precautions: Fall Precaution Comments: Abdominal; Log roll Restrictions Weight Bearing Restrictions: No     Mobility  Bed Mobility Overal bed mobility: Needs Assistance Bed Mobility: Sidelying to Sit, Sit to Sidelying, Rolling Rolling: Supervision Sidelying to sit: Used rails, Supervision     Sit to sidelying: Supervision, Used rails General bed mobility comments: able to perform bed mobility with no cuing required; slight increased time necessary    Transfers Overall transfer level: Needs assistance Equipment used: Rolling  walker (2 wheels) Transfers: Sit to/from Stand Sit to Stand: Supervision           General transfer comment: Pt able to perform STS using RW with close supA; brief cuing for hand placement    Ambulation/Gait Ambulation/Gait assistance: Contact guard assist Gait Distance (Feet): 110 Feet Assistive device: Rolling walker (2 wheels) Gait Pattern/deviations: Decreased step length - right, Decreased step length - left, Decreased stride length, Narrow base of support, Step-through pattern Gait velocity: Decreased     General Gait Details: slow and steady amb using RW; no reports of dizziness/lightheadedness during amb activity   Stairs             Wheelchair Mobility     Tilt Bed    Modified Rankin (Stroke Patients Only)       Balance Overall balance assessment: No apparent balance deficits (not formally assessed)                                          Cognition Arousal: Alert Behavior During Therapy: WFL for tasks assessed/performed Overall Cognitive Status: Within Functional Limits for tasks assessed                                 General Comments: Pleasant and cooperative with PT session        Exercises Other Exercises Other Exercises: Toileting; mod I for peri-care close supA    General Comments General comments (skin integrity, edema, etc.): no LOB noted during seated and standing balance with and  without AD      Pertinent Vitals/Pain Pain Assessment Pain Assessment: Faces Faces Pain Scale: Hurts a little bit Pain Location: Abdomen Pain Descriptors / Indicators: Sore Pain Intervention(s): Monitored during session, Repositioned    Home Living Family/patient expects to be discharged to:: Private residence Living Arrangements: Alone Available Help at Discharge: Neighbor;Family (No neighbors can provide physical assist. Pt reports today son and granddaughter can assist with household chores and checking in on  her) Type of Home: House Home Access: Level entry       Home Layout: Two level;Able to live on main level with bedroom/bathroom Home Equipment: Rolling Walker (2 wheels);Cane - single point;Grab bars - toilet;Grab bars - tub/shower      Prior Function            PT Goals (current goals can now be found in the care plan section) Acute Rehab PT Goals Patient Stated Goal: walk without pain PT Goal Formulation: With patient Time For Goal Achievement: 07/22/23 Potential to Achieve Goals: Good Progress towards PT goals: Progressing toward goals    Frequency    Min 1X/week      PT Plan      Co-evaluation              AM-PAC PT "6 Clicks" Mobility   Outcome Measure  Help needed turning from your back to your side while in a flat bed without using bedrails?: None Help needed moving from lying on your back to sitting on the side of a flat bed without using bedrails?: None Help needed moving to and from a bed to a chair (including a wheelchair)?: A Little Help needed standing up from a chair using your arms (e.g., wheelchair or bedside chair)?: A Little Help needed to walk in hospital room?: A Little Help needed climbing 3-5 steps with a railing? : A Little 6 Click Score: 20    End of Session   Activity Tolerance: Patient tolerated treatment well Patient left: in bed;with call bell/phone within reach;with bed alarm set;with SCD's reapplied Nurse Communication: Mobility status PT Visit Diagnosis: Other abnormalities of gait and mobility (R26.89);Difficulty in walking, not elsewhere classified (R26.2);Pain Pain - Right/Left:  (midline) Pain - part of body:  (abdomen)     Time: 1610-9604 PT Time Calculation (min) (ACUTE ONLY): 14 min  Charges:                            Elmon Else, SPT    Vanilla Heatherington 07/10/2023, 12:19 PM

## 2023-07-10 NOTE — Evaluation (Signed)
Occupational Therapy Evaluation Patient Details Name: Mia Gray MRN: [id number] DOB: 1956/09/01 Today's Date: 07/10/2023   History of Present Illness Patient is a 67 y.o. female presented to [facility] ED for abdominal pain. Current MD assessment: pneumoperitoneum; is now s/p: laparotomy, small bowel resection, and ventral hernia repair.   Clinical Impression   Pt was seen for OT evaluation this date. Prior to hospital admission, pt was living alone on ground level of her home and IND with ADLs/IADLs and mobility using RW. Pt was driving, but reports limited community mobility. Mostly stayed home and watched tv during the day. Son and granddaughter live close-by and can assist with household chores during recovery and check in on her.   Pt presents to acute OT demonstrating impaired ADL performance and functional mobility 2/2 pain following surgery with slight weakness (See OT problem list for additional functional deficits). Pain reported at 4-5/1- in abdomen today. Pt currently requires SUP for bed mobility with verb cues for log roll technique. CGA for STS and SPT from EOB to recliner with verb cues for hand placement. She sat in recliner and demo grooming and LB dressing task to doff/don socks with SUP. OT educated on use of AE/AD to prevent bending and promote pain management during ADL performance. She ambulated in the room using RW with CGA/SBA using RW for ~35 feet with no reports of increase in pain or dizziness.  Pt would benefit from skilled OT services to address noted impairments and functional limitations (see below for any additional details) in order to maximize safety and independence while minimizing falls risk and caregiver burden. Pt would prefer to return home with intermittent assistance from family, but is agreeable to SNF if she does not do well with PT today.       If plan is discharge home, recommend the following: A little help with walking and/or transfers;Assist for  transportation;Help with stairs or ramp for entrance;A little help with bathing/dressing/bathroom    Functional Status Assessment  Patient has had a recent decline in their functional status and demonstrates the ability to make significant improvements in function in a reasonable and predictable amount of time.  Equipment Recommendations  None recommended by OT    Recommendations for Other Services       Precautions / Restrictions Precautions Precautions: Fall Precaution Comments: Abdominal; Log roll Restrictions Weight Bearing Restrictions: No      Mobility Bed Mobility Overal bed mobility: Needs Assistance Bed Mobility: Rolling, Sidelying to Sit Rolling: Supervision Sidelying to sit: Used rails, Supervision       General bed mobility comments: Verb cues for log roll, pt able to perform slowly    Transfers Overall transfer level: Needs assistance Equipment used: Rolling walker (2 wheels) Transfers: Sit to/from Stand Sit to Stand: Contact guard assist           General transfer comment: verb cues for hand placement to push up from bed; CGA from EOB to RW      Balance Overall balance assessment: No apparent balance deficits (not formally assessed)                                         ADL either performed or assessed with clinical judgement   ADL Overall ADL's : Needs assistance/impaired                     Lower  Body Dressing: Supervision/safety Lower Body Dressing Details (indicate cue type and reason): to doff/don socks seated in recliner Toilet Transfer: Contact guard assist;Supervision/safety;Rolling walker (2 wheels) Toilet Transfer Details (indicate cue type and reason): simulated bed to recliner needing CGA/SBA using RW         Functional mobility during ADLs: Contact guard assist General ADL Comments: CGA for in room mobility to the door and back ~35 feet     Vision         Perception         Praxis          Pertinent Vitals/Pain Pain Assessment Pain Assessment: 0-10 Pain Score: 5  Pain Location: Abdomen Pain Descriptors / Indicators: Sore Pain Intervention(s): Monitored during session     Extremity/Trunk Assessment Upper Extremity Assessment Upper Extremity Assessment: Overall WFL for tasks assessed   Lower Extremity Assessment Lower Extremity Assessment: Generalized weakness       Communication Communication Communication: No apparent difficulties   Cognition Arousal: Alert Behavior During Therapy: WFL for tasks assessed/performed Overall Cognitive Status: Within Functional Limits for tasks assessed                                       General Comments  minimal pain to abdomen today    Exercises Other Exercises Other Exercises: Educated on use of AE/AD for pain management to prevent bending during ADL performance. Pt verbalized understanding of shoe horn, sock aide and reacher.   Shoulder Instructions      Home Living Family/patient expects to be discharged to:: Private residence Living Arrangements: Alone Available Help at Discharge: Neighbor;Family (No neighbors can provide physical assist. Pt reports today son and granddaughter can assist with household chores and checking in on her) Type of Home: House Home Access: Level entry     Home Layout: Two level;Able to live on main level with bedroom/bathroom     Bathroom Shower/Tub: Producer, television/film/video: Standard Bathroom Accessibility: Yes   Home Equipment: Agricultural consultant (2 wheels);Cane - single point;Grab bars - toilet;Grab bars - tub/shower          Prior Functioning/Environment Prior Level of Function : History of Falls (last six months);Independent/Modified Independent             Mobility Comments: Limited community ambulator without AD, pt endorses 1x fall where she got tripped up and landed on her knees, but overall fine now. Driving, ADLs Comments: Ind         OT Problem List: Decreased strength;Pain;Decreased knowledge of use of DME or AE      OT Treatment/Interventions: Self-care/ADL training;Therapeutic exercise;Therapeutic activities;Energy conservation;DME and/or AE instruction;Patient/family education    OT Goals(Current goals can be found in the care plan section) Acute Rehab OT Goals Patient Stated Goal: return home OT Goal Formulation: With patient Time For Goal Achievement: 07/24/23 Potential to Achieve Goals: Good  OT Frequency: Min 1X/week    Co-evaluation              AM-PAC OT "6 Clicks" Daily Activity     Outcome Measure Help from another person eating meals?: None Help from another person taking care of personal grooming?: None Help from another person toileting, which includes using toliet, bedpan, or urinal?: A Little Help from another person bathing (including washing, rinsing, drying)?: A Little Help from another person to put on and taking off regular upper body clothing?: None  Help from another person to put on and taking off regular lower body clothing?: A Little 6 Click Score: 21   End of Session Equipment Utilized During Treatment: Rolling walker (2 wheels) Nurse Communication: Mobility status  Activity Tolerance: Patient tolerated treatment well Patient left: in chair;with call bell/phone within reach;with chair alarm set  OT Visit Diagnosis: Muscle weakness (generalized) (M62.81);Pain Pain - part of body:  (abdomen)                Time: 1610-9604 OT Time Calculation (min): 31 min Charges:  OT General Charges $OT Visit: 1 Visit OT Evaluation $OT Eval Low Complexity: 1 Low OT Treatments $Therapeutic Activity: 8-22 mins  Tamikka Pilger, OTR/L 07/10/23, 11:17 AM  Kaizer Dissinger E Tyton Abdallah 07/10/2023, 11:12 AM

## 2023-07-10 NOTE — [place] FL2 (Signed)
[place] MEDICAID FL2 LEVEL OF CARE FORM     IDENTIFICATION  Patient Name: Mia Gray Birthdate: Jan 12, 1956 Sex: female Admission Date (Current Location): 07/08/2023  [facility] and [place] Number:  Chiropodist and Address:  [facility], [address] [place], [place] [address]      Provider Number: [id number]  Attending Physician Name and Address:  Leafy Ro, MD  Relative Name and Phone Number:       Current Level of Care: Hospital Recommended Level of Care: Skilled Nursing Facility Prior Approval Number:    Date Approved/Denied:   PASRR Number: [id number]  Discharge Plan: SNF    Current Diagnoses: Patient Active Problem List   Diagnosis Date Noted   Pneumoperitoneum 07/08/2023   Abdominal pain 07/08/2023   GI bleed 06/08/2015   Upper GI bleeding    Hiatal hernia 12/07/2013    Orientation RESPIRATION BLADDER Height & Weight     Self, Time, Situation, Place  Normal Continent Weight: 150 lb (68 kg) Height:  5\' 1"  (154.9 cm)  BEHAVIORAL SYMPTOMS/MOOD NEUROLOGICAL BOWEL NUTRITION STATUS   (None)  (None) Continent Diet (Full liquids. Advancing as tolerated.)  AMBULATORY STATUS COMMUNICATION OF NEEDS Skin   Limited Assist Verbally Surgical wounds (Incision on abdomen: Honeycomb.)                       Personal Care Assistance Level of Assistance  Bathing, Feeding, Dressing Bathing Assistance: Limited assistance Feeding assistance: Limited assistance Dressing Assistance: Limited assistance     Functional Limitations Info  Sight, Hearing, Speech Sight Info: Adequate Hearing Info: Adequate Speech Info: Adequate    SPECIAL CARE FACTORS FREQUENCY  PT (By licensed PT), OT (By licensed OT)     PT Frequency: 5 x week OT Frequency: 5 x week            Contractures Contractures Info: Not present    Additional Factors Info  Code Status, Allergies Code Status Info: Full code Allergies Info: Hydrocodone            Current Medications (07/10/2023):  This is the current hospital active medication list Current Facility-Administered Medications  Medication Dose Route Frequency Provider Last Rate Last Admin   acetaminophen (TYLENOL) tablet 1,000 mg  1,000 mg Oral Q6H Pabon, Diego F, MD   1,000 mg at 07/10/23 0601   enoxaparin (LOVENOX) injection 40 mg  40 mg Subcutaneous Q24H Lynden Oxford R, PA-C       lisinopril (ZESTRIL) tablet 5 mg  5 mg Oral Daily Donovan Kail, PA-C   5 mg at 07/10/23 0845   metoprolol succinate (TOPROL-XL) 24 hr tablet 25 mg  25 mg Oral Daily Donovan Kail, PA-C   25 mg at 07/10/23 0846   morphine (PF) 2 MG/ML injection 2-4 mg  2-4 mg Intravenous Q3H PRN Pabon, Diego F, MD       oxyCODONE (Oxy IR/ROXICODONE) immediate release tablet 5-10 mg  5-10 mg Oral Q4H PRN Pabon, Diego F, MD   5 mg at 07/09/23 1933   [START ON 07/12/2023] pantoprazole (PROTONIX) injection 40 mg  40 mg Intravenous Q12H Pabon, Diego F, MD       piperacillin-tazobactam (ZOSYN) IVPB 3.375 g  3.375 g Intravenous Q8H Pabon, Diego F, MD 12.5 mL/hr at 07/10/23 0601 3.375 g at 07/10/23 0601   prochlorperazine (COMPAZINE) injection 10 mg  10 mg Intravenous Q6H PRN Sterling Big F, MD   10 mg at 07/10/23 0225  topiramate (TOPAMAX) tablet 100 mg  100 mg Oral BID Donovan Kail, PA-C   100 mg at 07/10/23 1610     Discharge Medications: Please see discharge summary for a list of discharge medications.  Relevant Imaging Results:  Relevant Lab Results:   Additional Information SS#: [id number]  Margarito Liner, LCSW

## 2023-07-10 NOTE — TOC Initial Note (Addendum)
Transition of Care [facility]) - Initial/Assessment Note    Patient Details  Name: Mia Gray MRN: [id number] Date of Birth: Jul 28, 1956  [facility]) CM/SW Contact:    Margarito Liner, LCSW Phone Number: 07/10/2023, 11:10 AM  Clinical Narrative: CSW met with patient. No supports at bedside. CSW introduced role and explained that PT recommendations would be discussed. Patient would prefer to return home at discharge but is agreeable to CSW sending out SNF referral in case she does still need SNF by the time she is close to discharge. CSW provided CMS scores for facilities within 25 miles of her zip code. No further concerns. CSW encouraged patient to contact CSW as needed. CSW will continue to follow patient for support and facilitate discharge once medically stable.               4:24 pm: PT changed recommendation to home health. Patient is agreeable. CSW provided CMS scores for agencies that serve her zip code for her to review. CSW will follow up tomorrow for preference. She is agreeable to recommendation for RW.    Expected Discharge Plan: Skilled Nursing Facility Barriers to Discharge: Continued Medical Work up   Patient Goals and CMS Choice   CMS Medicare.gov Compare Post Acute Care list provided to:: Patient        Expected Discharge Plan and Services     Post Acute Care Choice:  (TBD) Living arrangements for the past 2 months: Apartment                                      Prior Living Arrangements/Services Living arrangements for the past 2 months: Apartment Lives with:: Self Patient language and need for interpreter reviewed:: Yes Do you feel safe going back to the place where you live?: Yes      Need for Family Participation in Patient Care: Yes (Comment) Care giver support system in place?: Yes (comment)   Criminal Activity/Legal Involvement Pertinent to Current Situation/Hospitalization: No - Comment as needed  Activities of Daily Living    ADL Screening (condition at time of admission) Independently performs ADLs?: Yes (appropriate for developmental age) Is the patient deaf or have difficulty hearing?: No Does the patient have difficulty seeing, even when wearing glasses/contacts?: No Does the patient have difficulty concentrating, remembering, or making decisions?: No  Permission Sought/Granted Permission sought to share information with : Facility Industrial/product designer granted to share information with : Yes, Verbal Permission Granted     Permission granted to share info w AGENCY: SNF's        Emotional Assessment Appearance:: Appears stated age Attitude/Demeanor/Rapport: Engaged Affect (typically observed): Calm Orientation: : Oriented to Self, Oriented to Place, Oriented to  Time, Oriented to Situation Alcohol / Substance Use: Not Applicable Psych Involvement: No (comment)  Admission diagnosis:  Pneumoperitoneum [K66.8] Free intraperitoneal air [K66.8] Abdominal pain, unspecified abdominal location [R10.9] Patient Active Problem List   Diagnosis Date Noted   Pneumoperitoneum 07/08/2023   Abdominal pain 07/08/2023   GI bleed 06/08/2015   Upper GI bleeding    Hiatal hernia 12/07/2013   PCP:  Gracelyn Nurse, MD Pharmacy:   [facility] [address] ([address]),  - [address] [place]) [place] [address] Phone: [phone] Fax: [phone]  [facility] [address] [place] - [address] [place] [address] Phone: [phone] Fax:  [phone]     Social Determinants of Health (SDOH) Social History: SDOH Screenings   Food Insecurity: No Food Insecurity (07/08/2023)  Housing: Low Risk  (07/08/2023)  Transportation Needs: No Transportation Needs (07/08/2023)  Utilities: Not At Risk (07/08/2023)  Tobacco Use: Low Risk  (07/08/2023)   SDOH Interventions:     Readmission Risk Interventions     No data to  display

## 2023-07-11 ENCOUNTER — Inpatient Hospital Stay: Payer: 59

## 2023-07-11 MED ORDER — OXYCODONE HCL 5 MG PO TABS: 5.0000 mg | ORAL_TABLET | Freq: Four times a day (QID) | ORAL | 0 refills | Status: DC | PRN

## 2023-07-11 MED ORDER — PANCRELIPASE (LIP-PROT-AMYL) 12000-38000 UNITS PO CPEP
36000.0000 [IU] | ORAL_CAPSULE | Freq: Three times a day (TID) | ORAL | Status: DC
  Administered 2023-07-11 – 2023-07-14 (×6): 36000 [IU] via ORAL
  Filled 2023-07-11 (×12): qty 3

## 2023-07-11 MED ORDER — AMOXICILLIN-POT CLAVULANATE 875-125 MG PO TABS
1.0000 | ORAL_TABLET | Freq: Two times a day (BID) | ORAL | 0 refills | Status: DC
Start: 2023-07-11 — End: 2023-07-15

## 2023-07-11 MED ORDER — FLUCONAZOLE 150 MG PO TABS
150.0000 mg | ORAL_TABLET | Freq: Once | ORAL | 0 refills | Status: AC
Start: 2023-07-11 — End: 2023-07-11

## 2023-07-11 NOTE — Discharge Instructions (Addendum)
In addition to included general post-operative instructions,  Diet: Resume home diet. Would recommend high fiber diet (handout given), maintaining adequate hydration, and avoiding constipation.   Activity: No heavy lifting >20 pounds (children, pets, laundry, garbage) or strenuous activity for 6 weeks, but light activity and walking are encouraged. Do not drive or drink alcohol if taking narcotic pain medications or having pain that might distract from driving. Okay to work with therapies at home.   Wound care: You may shower/get incision wet with soapy water and pat dry (do not rub incisions), but no baths or submerging incision underwater until follow-up. We will remove staples in follow up.   Medications: Resume all home medications. For mild to moderate pain: acetaminophen (Tylenol) or ibuprofen/naproxen (if no kidney disease). Combining Tylenol with alcohol can substantially increase your risk of causing liver disease. Narcotic pain medications, if prescribed, can be used for severe pain, though may cause nausea, constipation, and drowsiness. Do not combine Tylenol and Percocet (or similar) within a 6 hour period as Percocet (and similar) contain(s) Tylenol. If you do not need the narcotic pain medication, you do not need to fill the prescription.  Call office [phone] / [phone]) at any time if any questions, worsening pain, fevers/chills, bleeding, drainage from incision site, or other concerns.

## 2023-07-11 NOTE — Plan of Care (Signed)

## 2023-07-11 NOTE — Discharge Summary (Deleted)
[facility] SURGICAL DISCHARGE SUMMARY  Patient ID: Mia Gray MRN: [id number] DOB/AGE: [birth year] 69 y.o.  Admit date: 07/08/2023 Discharge date: 07/11/2023  Discharge Diagnoses Patient Active Problem List   Diagnosis Date Noted   Pneumoperitoneum 07/08/2023   Abdominal pain 07/08/2023    Consultants None  Procedures 07/08/2023: Exploratory Laparotomy Small Bowel Resection Repair of Incarcerated Ventral Hernia   HPI: 67 y.o. female presented to [facility] ED today for abdominal pain. Patient reports the acute onset of severe abdominal pain, centrally, yesterday afternoon. This was severe in nature. She took tylenol with minimal relief put the pain persisted throughout the night. She has felt nauseous. No fever, chills, emesis, or bowel changes. She has reported continuing to pass gas. She does report a previous history of ulcers; not taking any BC powders, NSAIDs. She does have a significant previous surgical history including multiple hiatal hernia repairs, bilateral inguinal hernia repairs, cholecystectomy. Work up in the ED revealed a mild leukocytosis to 11.7K, Hgb 14.6, sCr - 0.95, no electrolyte derangements, lipase normal at 24. CT Abdomen/pelvis was concerning for pneumoperitoneum, no clear source.    Hospital Course: Informed consent was obtained and documented, and patient underwent uneventful exploratory laparotomy, small bowel resection, and ventral hernia repair (Dr Everlene Farrier, 07/08/2023).  Post-operatively, patient did well without complication. She had adequate return of bowel function. Advancement of patient's diet and ambulation were well-tolerated. The remainder of patient's hospital course was essentially unremarkable, and discharge planning was initiated accordingly with patient safely able to be discharged home with appropriate discharge instructions, antibiotics (Augmentin x4 days to complete 7 total given bowel perforation), pain control, and outpatient  follow-up after all of her questions were answered to her expressed satisfaction.   Discharge Condition: Good   Physical Examination:  Constitutional: alert, cooperative and no distress  Respiratory: breathing non-labored at rest  Cardiovascular: regular rate and sinus rhythm  Gastrointestinal: soft, incisional soreness expectedly, and non-distended. No rebound/guarding Integumentary: Laparotomy is CDI with staples; honeycomb removed,, early ecchymosis, no erythema    Allergies as of 07/11/2023       Reactions   Hydrocodone Nausea And Vomiting        Medication List     TAKE these medications    acetaminophen 325 MG tablet Commonly known as: TYLENOL Take 2 tablets (650 mg total) by mouth 2 (two) times daily as needed for moderate pain or fever.   amoxicillin-clavulanate 875-125 MG tablet Commonly known as: AUGMENTIN Take 1 tablet by mouth 2 (two) times daily for 4 days.   cetirizine 10 MG tablet Commonly known as: ZYRTEC Take 10 mg by mouth daily as needed for allergies.   Creon 36000-114000 units Cpep capsule Generic drug: lipase/protease/amylase Take 36,000 Units by mouth 3 (three) times daily.   cyanocobalamin 1000 MCG tablet Commonly known as: VITAMIN B12 Take by mouth.   fluconazole 150 MG tablet Commonly known as: Diflucan Take 1 tablet (150 mg total) by mouth once for 1 dose.   GARLIC PO Take by mouth.   ketoconazole 2 % shampoo Commonly known as: NIZORAL Shampoo into scalp let sit 5-10 minutes then wash out. Use 3 days per week.   lisinopril 10 MG tablet Commonly known as: ZESTRIL Take 5 mg by mouth daily.   lisinopril 5 MG tablet Commonly known as: ZESTRIL Take 5 mg by mouth daily.   metoprolol succinate 25 MG 24 hr tablet Commonly known as: TOPROL-XL Take 1 tablet by mouth daily.   metoprolol tartrate 25 MG tablet Commonly known  as: LOPRESSOR Take 25 mg by mouth daily.   mometasone 0.1 % lotion Commonly known as: ELOCON Apply to  itchy areas of scalp qd up to 5 days per week prn itching   ondansetron 4 MG disintegrating tablet Commonly known as: ZOFRAN-ODT Take 4 mg by mouth every 8 (eight) hours as needed for nausea or vomiting.   ondansetron 4 MG tablet Commonly known as: ZOFRAN Take 4 mg by mouth every 8 (eight) hours as needed for nausea.   oxyCODONE 5 MG immediate release tablet Commonly known as: Oxy IR/ROXICODONE Take 1 tablet (5 mg total) by mouth every 6 (six) hours as needed for severe pain (pain score 7-10) or breakthrough pain.   SUMAtriptan 100 MG tablet Commonly known as: IMITREX TAKE 1 TABLET ONCE DAILY AS NEEDED FOR MIGRAINE HEADACHE.  MAY TAKE A SECOND DOSE AFTER 2 HOURS IF NEEDED   topiramate 100 MG tablet Commonly known as: TOPAMAX Take 100 mg by mouth 2 (two) times daily.   Vitamin D-1000 Max St 25 MCG (1000 UT) tablet Generic drug: Cholecalciferol Take by mouth.               Durable Medical Equipment  (From admission, onward)           Start     Ordered   07/11/23 0747  For home use only DME Walker rolling  Once       Question Answer Comment  Walker: With 5 Inch Wheels   Patient needs a walker to treat with the following condition Physical deconditioning      07/11/23 0747              Follow-up Information     Leafy Ro, MD. Go on 07/21/2023.   Specialty: General Surgery Why: Go to appointment on 10/28 at 845 AM Contact information: [address] [place] [address] [phone]                  Time spent on discharge management including discussion of hospital course, clinical condition, outpatient instructions, prescriptions, and follow up with the patient and members of the medical team: >30 minutes  -- Lynden Oxford , PA-C [facility]  07/11/2023, 8:37 AM [phone] M-F: 7am - 4pm

## 2023-07-11 NOTE — Anesthesia Postprocedure Evaluation (Signed)
Anesthesia Post Note  Patient: DOHA HARKER  Procedure(s) Performed: EXPLORATORY LAPAROTOMY WITH SMALL BOWEL RESECTION (Abdomen) HERNIA REPAIR VENTRAL ADULT (Abdomen)  Patient location during evaluation: PACU Anesthesia Type: General Level of consciousness: awake and alert Pain management: pain level controlled Vital Signs Assessment: post-procedure vital signs reviewed and stable Respiratory status: spontaneous breathing, nonlabored ventilation, respiratory function stable and patient connected to nasal cannula oxygen Cardiovascular status: blood pressure returned to baseline and stable Postop Assessment: no apparent nausea or vomiting Anesthetic complications: no   No notable events documented.   Last Vitals:  Vitals:   07/11/23 0248 07/11/23 0736  BP: 112/69 123/74  Pulse: 68 68  Resp: 18 16  Temp: 36.8 C 36.6 C  SpO2: 98% 98%    Last Pain:  Vitals:   07/11/23 0736  TempSrc: Oral  PainSc:                  Yevette Edwards

## 2023-07-11 NOTE — Progress Notes (Signed)
[facility] SURGICAL PROGRESS NOTE  Hospital Day(s): 3.   Post op day(s): 3 Days Post-Op.   Interval History:  Patient seen and examined No acute events or new complaints overnight.  Patient reports she is feeling better this morning Abdominal soreness is improving Intermittent nausea remains No fever, chills, emesis  No new labs She is on soft; tolerating No reports scant flatus and small watery BM Ambulating with PT; progressed to [facility]  Vital signs in last 24 hours: [min-max] current  Temp:  [97.8 F (36.6 C)-98.4 F (36.9 C)] 97.8 F (36.6 C) (10/18 0736) Pulse Rate:  [68-79] 68 (10/18 0736) Resp:  [16-18] 16 (10/18 0736) BP: (112-123)/(58-74) 123/74 (10/18 0736) SpO2:  [96 %-98 %] 98 % (10/18 0736)     Height: 5\' 1"  (154.9 cm) Weight: 68 kg BMI (Calculated): 28.36   Intake/Output last 2 shifts:  10/17 0701 - 10/18 0700 In: 334.9 [P.O.:240; IV Piggyback:94.9] Out: -    Physical Exam:  Constitutional: alert, cooperative and no distress  Respiratory: breathing non-labored at rest  Cardiovascular: regular rate and sinus rhythm  Gastrointestinal: soft, incisional soreness expectedly, and non-distended. No rebound/guarding Integumentary: Laparotomy is CDI with staples; serosanguinous drainage on honeycomb, no erythema   Labs:     Latest Ref Rng & Units 07/10/2023    5:15 AM 07/09/2023    6:12 AM 07/08/2023    9:57 AM  CBC  WBC 4.0 - 10.5 K/uL 9.7  10.6  11.7   Hemoglobin 12.0 - 15.0 g/dL 25.3  66.4  40.3   Hematocrit 36.0 - 46.0 % 32.2  38.4  41.8   Platelets 150 - 400 K/uL 206  228  256       Latest Ref Rng & Units 07/10/2023    5:15 AM 07/09/2023    6:12 AM 07/08/2023    9:57 AM  CMP  Glucose 70 - 99 mg/dL 474  259  563   BUN 8 - 23 mg/dL 9  12  14    Creatinine 0.44 - 1.00 mg/dL 8.75  6.43  3.29   Sodium 135 - 145 mmol/L 136  137  137   Potassium 3.5 - 5.1 mmol/L 3.7  4.6  4.2   Chloride 98 - 111 mmol/L 112  109  108   CO2 22 - 32 mmol/L  18  18  19    Calcium 8.9 - 10.3 mg/dL 7.9  7.8  8.4   Total Protein 6.5 - 8.1 g/dL  6.5  7.2   Total Bilirubin 0.3 - 1.2 mg/dL  0.9  1.1   Alkaline Phos 38 - 126 U/L  55  64   AST 15 - 41 U/L  26  23   ALT 0 - 44 U/L  21  15      Imaging studies:   KUB (07/11/2023) personally reviewed showing dilated loops of small bowel throughout consistent with possible ileus, no free air, and radiologist report pending...   Assessment/Plan:  67 y.o. 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 Continue FLD for now. If bowel function returns and she caqn continue to have better bowel function, okay for DC home. No rush. I have prepped all DC instructions and follow-up, seems HH has been established as well.   All of the above findings and recommendations were discussed with the patient, patient's family at bedside, and the medical team, and all of patient's and family's questions were answered to their expressed satisfaction.  -- Lynden Oxford, PA-C [facility] 07/11/2023, 10:12 AM M-F: 7am - 4pm

## 2023-07-11 NOTE — TOC Transition Note (Signed)
Transition of Care [facility]) - CM/SW Discharge Note   Patient Details  Name: Mia Gray MRN: [id number] Date of Birth: 01-15-1956  Transition of Care [facility]) CM/SW Contact:  Margarito Liner, LCSW Phone Number: 07/11/2023, 9:28 AM   Clinical Narrative:  Patient has orders to discharge home today. Her preferred [facility] health agency [facility] and they have accepted her referral for PT and OT. RW ordered through [employer] to be delivered to the room before she leaves. No further concerns. CSW signing off.   Final next level of care: Home w Home Health Services Barriers to Discharge: Barriers Resolved   Patient Goals and CMS Choice CMS Medicare.gov Compare Post Acute Care list provided to:: Patient Choice offered to / list presented to : Patient  Discharge Placement                  Patient to be transferred to facility by: Family   Patient and family notified of of transfer: 07/11/23  Discharge Plan and Services Additional resources added to the After Visit Summary for       Post Acute Care Choice:  (TBD)          DME Arranged: Dan Humphreys rolling DME Agency: [employer] Date DME Agency Contacted: 07/11/23   Representative spoke with at DME Agency: Yvone Neu HH Arranged: PT, OT [facility] Agency: [facility] Date [facility] Agency Contacted: 07/11/23   Representative spoke with at [facility] Agency: Lorenza Chick  Social Determinants of Health (SDOH) Interventions SDOH Screenings   Food Insecurity: No Food Insecurity (07/08/2023)  Housing: Low Risk  (07/08/2023)  Transportation Needs: No Transportation Needs (07/08/2023)  Utilities: Not At Risk (07/08/2023)  Tobacco Use: Low Risk  (07/08/2023)     Readmission Risk Interventions     No data to display

## 2023-07-11 NOTE — Plan of Care (Signed)
CHL Tonsillectomy/Adenoidectomy, Postoperative PEDS care plan entered in error.

## 2023-07-11 NOTE — Care Management Important Message (Signed)
Important Message  Patient Details  Name: Mia Gray MRN: [id number] Date of Birth: 01/03/1956   Important Message Given:  N/A - LOS <3 / Initial given by admissions     Mia Gray 07/11/2023, 9:30 AM

## 2023-07-11 NOTE — Progress Notes (Signed)
Occupational Therapy Treatment Patient Details Name: Mia Gray MRN: [id number] DOB: Sep 30, 1955 Today's Date: 07/11/2023   History of present illness Patient is a 67 y.o. female presented to [facility] ED for abdominal pain. Current MD assessment: pneumoperitoneum; is now s/p: laparotomy, small bowel resection, and ventral hernia repair.   OT comments  Pt is supine in bed on arrival. Pleasant and agreeable to OT session. She denies pain. Wished to engage in shower, OT had it cleared with MD. Nurse found to stop IV. Pt demo bed mobility with MOD I. STS to RW and mobility to the bathroom with MOD I/SUP. She performed full shower seated on BSC using HH shower head with SUP to Mod I. Pt performed all dressing and grooming and oral care standing at sink in room with SUP/MOD I. She denied any pain following session and appears to no longer require acute skilled OT services with all goals met. OT to sign off. HHOT consult recommended to ensure home set up safe and appropriate and modifications needed.       If plan is discharge home, recommend the following:  Assist for transportation;Help with stairs or ramp for entrance;Assistance with cooking/housework   Equipment Recommendations  None recommended by OT    Recommendations for Other Services      Precautions / Restrictions Restrictions Weight Bearing Restrictions: No       Mobility Bed Mobility Overal bed mobility: Needs Assistance Bed Mobility: Sidelying to Sit, Sit to Sidelying, Rolling Rolling: Supervision Sidelying to sit: Used rails, Supervision     Sit to sidelying: Supervision, Used rails General bed mobility comments: able to perform bed mobility with no cuing required    Transfers Overall transfer level: Needs assistance   Transfers: Sit to/from Stand Sit to Stand: Modified independent (Device/Increase time)           General transfer comment: MOD I for STS from EOB and BSC in shower     Balance Overall balance  assessment: No apparent balance deficits (not formally assessed)                                         ADL either performed or assessed with clinical judgement   ADL       Grooming: Standing;Applying deodorant;Oral care;Supervision/safety;Modified independent   Upper Body Bathing: Modified independent   Lower Body Bathing: Supervison/ safety;Sit to/from stand       Lower Body Dressing: Modified independent;Sitting/lateral leans                 General ADL Comments: Pt performed full shower seated on BSC with HH shower head following bending precautions with SUP.    Extremity/Trunk Assessment              Vision       Perception     Praxis      Cognition Arousal: Alert Behavior During Therapy: WFL for tasks assessed/performed Overall Cognitive Status: Within Functional Limits for tasks assessed                                 General Comments: Pleasant and cooperative with PT session        Exercises      Shoulder Instructions       General Comments      Pertinent Vitals/ Pain  Pain Assessment Pain Assessment: No/denies pain  Home Living                                          Prior Functioning/Environment              Frequency           Progress Toward Goals  OT Goals(current goals can now be found in the care plan section)  Progress towards OT goals: Goals met/education completed, patient discharged from OT  Acute Rehab OT Goals Patient Stated Goal: return home OT Goal Formulation: With patient Time For Goal Achievement: 07/24/23 Potential to Achieve Goals: Good  Plan      Co-evaluation                 AM-PAC OT "6 Clicks" Daily Activity     Outcome Measure   Help from another person eating meals?: None Help from another person taking care of personal grooming?: None Help from another person toileting, which includes using toliet, bedpan, or urinal?:  None Help from another person bathing (including washing, rinsing, drying)?: None Help from another person to put on and taking off regular upper body clothing?: None Help from another person to put on and taking off regular lower body clothing?: None 6 Click Score: 24    End of Session Equipment Utilized During Treatment: Rolling walker (2 wheels)  OT Visit Diagnosis: Muscle weakness (generalized) (M62.81);Pain   Activity Tolerance Patient tolerated treatment well   Patient Left with call bell/phone within reach;in bed;with bed alarm set   Nurse Communication Mobility status        Time: 1610-9604 OT Time Calculation (min): 48 min  Charges: OT General Charges $OT Visit: 1 Visit OT Treatments $Self Care/Home Management : 38-52 mins  Sharin Altidor, OTR/L  07/11/23, 4:14 PM  Nevae Pinnix E Charae Depaolis 07/11/2023, 4:11 PM

## 2023-07-12 ENCOUNTER — Inpatient Hospital Stay: Payer: 59

## 2023-07-12 MED ORDER — ORAL CARE MOUTH RINSE: 15.0000 mL | OROMUCOSAL | Status: DC | PRN

## 2023-07-12 MED ORDER — SODIUM CHLORIDE 0.9 % IV SOLN: INTRAVENOUS | Status: DC | PRN

## 2023-07-12 NOTE — Progress Notes (Signed)
Patient has IV team consult placed right arm restriction and no visible area on the left arm to place new PIV for antibiotic infusions.

## 2023-07-12 NOTE — Progress Notes (Signed)
Patient ID: BARRI PIETZ, female   DOB: October 17, 1955, 67 y.o.   MRN: [id number] Surgery Note  I was called by nurse because the patient has been the respectful and complaining that she wants to go home.  Early in the morning when I evaluated the patient she was having pain in the upper abdomen.  The pain was aggravated by oral intake.  She was downgraded to full liquid diet yesterday due to the abdominal pain and concern of ileus.  Since the patient was still having abdominal pain this morning and she was very clear that she was still having pain even with the full liquids I decided to give her a bowel rest due to concern of ileus.  During the day the patient endorses that she was feeling better after having a bowel movement.  I reevaluated the patient and she is more calm and with decreased pain.  I discussed with patient that I will suggest to restart full liquid diet and assess for toleration.  The plan is to see if she tolerates full liquids tonight and tomorrow morning and then to see if she can be advanced to soft diet.  As per patient, she endorses that she has any eating any solid food in the last 2 or 3 months.  She has been having chronic abdominal pain for the last 2 or 3 months.  I evaluated the patient in the presence of the nurse and patient reported she agreed with plan of trying for liquid diet.  Carolan Shiver, MD

## 2023-07-12 NOTE — Plan of Care (Signed)

## 2023-07-12 NOTE — Progress Notes (Signed)
Patient ID: Mia Gray, female   DOB: [birth year], 67 y.o.   MRN: [id number]     SURGICAL PROGRESS NOTE   Hospital Day(s): 4.   Interval History: Patient seen and examined, no acute events or new complaints overnight. Patient reports she continued passing gas but is not having bowel movements.  She has been concerning due to pain that is aggravated with eating.  She understood that once she started eating her appetite goes away.  No vomiting.  Vital signs in last 24 hours: [min-max] current  Temp:  [98.1 F (36.7 C)-98.5 F (36.9 C)] 98.1 F (36.7 C) (10/19 0252) Pulse Rate:  [56-78] 56 (10/19 0252) Resp:  [16-18] 18 (10/19 0252) BP: (116-119)/(65-73) 116/65 (10/19 0252) SpO2:  [97 %-98 %] 98 % (10/19 0252)     Height: 5\' 1"  (154.9 cm) Weight: 68 kg BMI (Calculated): 28.36   Physical Exam:  Constitutional: alert, cooperative and no distress  Respiratory: breathing non-labored at rest  Cardiovascular: regular rate and sinus rhythm  Gastrointestinal: soft, non-tender, and non-distended  Labs:     Latest Ref Rng & Units 07/10/2023    5:15 AM 07/09/2023    6:12 AM 07/08/2023    9:57 AM  CBC  WBC 4.0 - 10.5 K/uL 9.7  10.6  11.7   Hemoglobin 12.0 - 15.0 g/dL 40.9  81.1  91.4   Hematocrit 36.0 - 46.0 % 32.2  38.4  41.8   Platelets 150 - 400 K/uL 206  228  256       Latest Ref Rng & Units 07/10/2023    5:15 AM 07/09/2023    6:12 AM 07/08/2023    9:57 AM  CMP  Glucose 70 - 99 mg/dL 782  956  213   BUN 8 - 23 mg/dL 9  12  14    Creatinine 0.44 - 1.00 mg/dL 0.86  5.78  4.69   Sodium 135 - 145 mmol/L 136  137  137   Potassium 3.5 - 5.1 mmol/L 3.7  4.6  4.2   Chloride 98 - 111 mmol/L 112  109  108   CO2 22 - 32 mmol/L 18  18  19    Calcium 8.9 - 10.3 mg/dL 7.9  7.8  8.4   Total Protein 6.5 - 8.1 g/dL  6.5  7.2   Total Bilirubin 0.3 - 1.2 mg/dL  0.9  1.1   Alkaline Phos 38 - 126 U/L  55  64   AST 15 - 41 U/L  26  23   ALT 0 - 44 U/L  21  15     Imaging studies:  My  interpretation of the abdomen x-ray this morning is mildly improvement of small bowel dilation.  Still with some air-fluid levels.   Assessment/Plan:  67 y.o. female with small bowel perforation due to diverticulitis of the small bowel 4 Days Post-Op s/p small bowel resection.  -Today patient continue with stable vital signs, no fever -There is no significant improvement in her symptoms.  She continued having difficulty eating.  She understood that she feels hungry but once she started eating she started having pain in the upper abdomen.  No nausea or vomiting.  Loss of appetite. -Abdominal x-ray with minimal improvement of the small bowel dilation.  This is consistent with ileus -Due to the worsening abdominal pain, I will keep patient on bowel rest -Will need to start IV fluids to keep patient hydrated since she is currently n.p.o. -Encouraged the patient to ambulate  Lurline Idol  Mia Dilling, MD

## 2023-07-12 NOTE — Progress Notes (Signed)
Physical Therapy Treatment Patient Details Name: Mia Gray MRN: [id number] DOB: 1956/05/22 Today's Date: 07/12/2023   History of Present Illness Patient is a 67 y.o. female presented to [facility] ED for abdominal pain. Current MD assessment: pneumoperitoneum; is now s/p: laparotomy, small bowel resection, and ventral hernia repair.    PT Comments  OOB,, to bathroom to void then completes x 2 laps on unit with light finger tip support on IV pole.  She stated she feels she does not need walker and overall does well and is able to don/doff underpants in standing with light UE support and no issue.  Progressing well with mobility.  Would benefit from a RW for community and outside ambulation but is unlikely she will use in her home.   If plan is discharge home, recommend the following: A little help with walking and/or transfers;A little help with bathing/dressing/bathroom;Assist for transportation;Help with stairs or ramp for entrance   Can travel by private vehicle        Equipment Recommendations  Rolling walker (2 wheels)    Recommendations for Other Services       Precautions / Restrictions Restrictions Weight Bearing Restrictions: No     Mobility  Bed Mobility Overal bed mobility: Modified Independent               Patient Response: Cooperative  Transfers Overall transfer level: Modified independent     Sit to Stand: Modified independent (Device/Increase time)                Ambulation/Gait Ambulation/Gait assistance: Contact guard assist Gait Distance (Feet): 400 Feet Assistive device: IV Pole   Gait velocity: Decreased     General Gait Details: generally slow but steady gait with finger tip assist on IV pole for balance   Stairs             Wheelchair Mobility     Tilt Bed Tilt Bed Patient Response: Cooperative  Modified Rankin (Stroke Patients Only)       Balance Overall balance assessment: Modified Independent                                           Cognition Arousal: Alert Behavior During Therapy: WFL for tasks assessed/performed Overall Cognitive Status: Within Functional Limits for tasks assessed                                 General Comments: teary upon arrival feeling overwhelmed with stay        Exercises      General Comments        Pertinent Vitals/Pain Pain Assessment Pain Assessment: No/denies pain    Home Living                          Prior Function            PT Goals (current goals can now be found in the care plan section) Progress towards PT goals: Progressing toward goals    Frequency    Min 1X/week      PT Plan      Co-evaluation              AM-PAC PT "6 Clicks" Mobility   Outcome Measure  Help needed turning from your back to your side  while in a flat bed without using bedrails?: None Help needed moving from lying on your back to sitting on the side of a flat bed without using bedrails?: None Help needed moving to and from a bed to a chair (including a wheelchair)?: None Help needed standing up from a chair using your arms (e.g., wheelchair or bedside chair)?: None Help needed to walk in hospital room?: A Little Help needed climbing 3-5 steps with a railing? : A Little 6 Click Score: 22    End of Session   Activity Tolerance: Patient tolerated treatment well Patient left: in bed;with call bell/phone within reach;with bed alarm set Nurse Communication: Mobility status PT Visit Diagnosis: Other abnormalities of gait and mobility (R26.89);Difficulty in walking, not elsewhere classified (R26.2);Pain     Time: 2440-1027 PT Time Calculation (min) (ACUTE ONLY): 13 min  Charges:    $Gait Training: 8-22 mins PT General Charges $$ ACUTE PT VISIT: 1 Visit                   Danielle Dess, PTA 07/12/23, 11:10 AM

## 2023-07-12 NOTE — Progress Notes (Signed)
Notified MD patient refusing care and disrespectful and irate, notified charge RN as well that patient was doing this with the CNA as well as myself. Consulting civil engineer and I went in together to speak to patient, patient agreed to move forward with interventions.

## 2023-07-13 NOTE — Progress Notes (Signed)
Patient ID: Mia Gray, female   DOB: November 25, 1955, 67 y.o.   MRN: [id number]     SURGICAL PROGRESS NOTE   Hospital Day(s): 5.   Interval History: Patient seen and examined, no acute events or new complaints overnight. Patient reports she did well with the full liquid last night.  She continued having bowel movement and passing gas.  We tried soft diet today during the day and she just started having epigastric pain again.  Patient scared about eating solid food.  Vital signs in last 24 hours: [min-max] current  Temp:  [98.1 F (36.7 C)-98.6 F (37 C)] 98.1 F (36.7 C) (10/20 0812) Pulse Rate:  [81-96] 83 (10/20 0812) Resp:  [18] 18 (10/20 0812) BP: (151-174)/(72-83) 160/72 (10/20 0812) SpO2:  [98 %-99 %] 98 % (10/20 0812)     Height: 5\' 1"  (154.9 cm) Weight: 68 kg BMI (Calculated): 28.36   Physical Exam:  Constitutional: alert, cooperative and no distress  Respiratory: breathing non-labored at rest  Cardiovascular: regular rate and sinus rhythm  Gastrointestinal: soft, non-tender, and non-distended  Labs:     Latest Ref Rng & Units 07/10/2023    5:15 AM 07/09/2023    6:12 AM 07/08/2023    9:57 AM  CBC  WBC 4.0 - 10.5 K/uL 9.7  10.6  11.7   Hemoglobin 12.0 - 15.0 g/dL 40.9  81.1  91.4   Hematocrit 36.0 - 46.0 % 32.2  38.4  41.8   Platelets 150 - 400 K/uL 206  228  256       Latest Ref Rng & Units 07/10/2023    5:15 AM 07/09/2023    6:12 AM 07/08/2023    9:57 AM  CMP  Glucose 70 - 99 mg/dL 782  956  213   BUN 8 - 23 mg/dL 9  12  14    Creatinine 0.44 - 1.00 mg/dL 0.86  5.78  4.69   Sodium 135 - 145 mmol/L 136  137  137   Potassium 3.5 - 5.1 mmol/L 3.7  4.6  4.2   Chloride 98 - 111 mmol/L 112  109  108   CO2 22 - 32 mmol/L 18  18  19    Calcium 8.9 - 10.3 mg/dL 7.9  7.8  8.4   Total Protein 6.5 - 8.1 g/dL  6.5  7.2   Total Bilirubin 0.3 - 1.2 mg/dL  0.9  1.1   Alkaline Phos 38 - 126 U/L  55  64   AST 15 - 41 U/L  26  23   ALT 0 - 44 U/L  21  15     Imaging  studies: No new pertinent imaging studies   Assessment/Plan:  67 y.o. female with small bowel perforation due to diverticulitis of the small bowel 5 Days Post-Op s/p small bowel resection.   -Patient did okay with the full liquid diet but not tolerating soft diet.  She continued having pain after eating soft diet. -Passing gas, no need of NGT.  No nausea or vomiting. -Will return to liquid diet and will advance carefully to solid once she feels more comfortable -Continue pain management -Encouraged the patient to ambulate  Gae Gallop, MD

## 2023-07-13 NOTE — Plan of Care (Signed)

## 2023-07-14 ENCOUNTER — Inpatient Hospital Stay: Payer: 59

## 2023-07-14 LAB — SURGICAL PATHOLOGY

## 2023-07-14 NOTE — TOC Progression Note (Signed)
TOC continuing to follow patient's progress throughout discharge planning.  11:00pm Spoke with patient at the bedside regarding housing. She stated she was previously given information for housing. She stated she is active with PT/ OT via [facility].

## 2023-07-14 NOTE — Progress Notes (Signed)
[facility] SURGICAL PROGRESS NOTE  Hospital Day(s): 6.   Post op day(s): 6 Days Post-Op.   Interval History:  Patient seen and examined No acute events or new complaints overnight.  Patient reports she is doing okay this morning She reports intermittent episodes of epigastric pain with eating' "sometimes it happens, sometimes I'm fine." She is worried that her previous hiatal hernia repairs have "come undone" No fever, chills, emesis  No new labs this morning  Diet backed down to CLD; will try again this AM She is passing flatus and having BM Ambulating with PT  Vital signs in last 24 hours: [min-max] current  Temp:  [98.1 F (36.7 C)-98.9 F (37.2 C)] 98.9 F (37.2 C) (10/21 0344) Pulse Rate:  [69-83] 69 (10/21 0344) Resp:  [18] 18 (10/20 1913) BP: (157-160)/(72-83) 160/79 (10/21 0344) SpO2:  [98 %] 98 % (10/21 0344)     Height: 5\' 1"  (154.9 cm) Weight: 68 kg BMI (Calculated): 28.36   Intake/Output last 2 shifts:  10/20 0701 - 10/21 0700 In: 240 [P.O.:240] Out: -    Physical Exam:  Constitutional: alert, cooperative and no distress  Respiratory: breathing non-labored at rest  Cardiovascular: regular rate and sinus rhythm  Gastrointestinal: soft, incisional soreness expectedly, and non-distended. No rebound/guarding Integumentary: Laparotomy is CDI with staples; ecchymotic expectedly, no erythema   Labs:     Latest Ref Rng & Units 07/10/2023    5:15 AM 07/09/2023    6:12 AM 07/08/2023    9:57 AM  CBC  WBC 4.0 - 10.5 K/uL 9.7  10.6  11.7   Hemoglobin 12.0 - 15.0 g/dL 41.3  24.4  01.0   Hematocrit 36.0 - 46.0 % 32.2  38.4  41.8   Platelets 150 - 400 K/uL 206  228  256       Latest Ref Rng & Units 07/10/2023    5:15 AM 07/09/2023    6:12 AM 07/08/2023    9:57 AM  CMP  Glucose 70 - 99 mg/dL 272  536  644   BUN 8 - 23 mg/dL 9  12  14    Creatinine 0.44 - 1.00 mg/dL 0.34  7.42  5.95   Sodium 135 - 145 mmol/L 136  137  137   Potassium 3.5 - 5.1  mmol/L 3.7  4.6  4.2   Chloride 98 - 111 mmol/L 112  109  108   CO2 22 - 32 mmol/L 18  18  19    Calcium 8.9 - 10.3 mg/dL 7.9  7.8  8.4   Total Protein 6.5 - 8.1 g/dL  6.5  7.2   Total Bilirubin 0.3 - 1.2 mg/dL  0.9  1.1   Alkaline Phos 38 - 126 U/L  55  64   AST 15 - 41 U/L  26  23   ALT 0 - 44 U/L  21  15      Imaging studies:  No new imaging studies   Assessment/Plan:  67 y.o. 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  -- Lynden Oxford, PA-C [facility] 07/14/2023, 7:52 AM M-F: 7am - 4pm

## 2023-07-14 NOTE — Progress Notes (Signed)
Pt tolerated full liquid diet very well for lunch.

## 2023-07-14 NOTE — Plan of Care (Signed)

## 2023-07-14 NOTE — Progress Notes (Signed)
Mobility Specialist - Progress Note    07/14/23 1007  Mobility  Activity Ambulated independently in hallway  Level of Assistance Independent after set-up  Assistive Device Front wheel walker  Distance Ambulated (ft) 180 ft  Range of Motion/Exercises Active  Activity Response Tolerated well  Mobility Referral Yes  $Mobility charge 1 Mobility  Mobility Specialist Start Time (ACUTE ONLY) 0950  Mobility Specialist Stop Time (ACUTE ONLY) 1009  Mobility Specialist Time Calculation (min) (ACUTE ONLY) 19 min   Pt standing at sink upon entry on RA. Pt endorses no pain or discomfort during ambulation with no RW. Pt STS and ambulates around NS for 1 lap. Pt returned to bed and left with needs in reach.   Johnathan Hausen Mobility Specialist 07/14/23, 10:17 AM

## 2023-07-15 LAB — CBC
HCT: 33.6 % — ABNORMAL LOW (ref 36.0–46.0)
Hemoglobin: 12.3 g/dL (ref 12.0–15.0)
MCH: 31.3 pg (ref 26.0–34.0)
MCHC: 36.6 g/dL — ABNORMAL HIGH (ref 30.0–36.0)
MCV: 85.5 fL (ref 80.0–100.0)
Platelets: 310 10*3/uL (ref 150–400)
RBC: 3.93 MIL/uL (ref 3.87–5.11)
RDW: 13.1 % (ref 11.5–15.5)
WBC: 5.2 10*3/uL (ref 4.0–10.5)
nRBC: 0 % (ref 0.0–0.2)

## 2023-07-15 LAB — BASIC METABOLIC PANEL
Anion gap: 10 (ref 5–15)
BUN: <5 mg/dL — ABNORMAL LOW (ref 8–23)
CO2: 20 mmol/L — ABNORMAL LOW (ref 22–32)
Calcium: 8.5 mg/dL — ABNORMAL LOW (ref 8.9–10.3)
Chloride: 111 mmol/L (ref 98–111)
Creatinine, Ser: 0.71 mg/dL (ref 0.44–1.00)
GFR, Estimated: >60 mL/min (ref >60)
Glucose, Bld: 99 mg/dL (ref 70–99)
Potassium: 3.5 mmol/L (ref 3.5–5.1)
Sodium: 141 mmol/L (ref 135–145)

## 2023-07-15 NOTE — Care Management Important Message (Signed)
Important Message  Patient Details  Name: Mia Gray MRN: [id number] Date of Birth: 1956-03-09   Important Message Given:  Yes - Medicare IM     Truddie Hidden, RN 07/15/2023, 9:51 AM

## 2023-07-15 NOTE — Discharge Summary (Addendum)
[facility] SURGICAL DISCHARGE SUMMARY  Patient ID: LYLIE FREEDMAN MRN: [id number] DOB/AGE: 69-Dec-1957 69 y.o.  Admit date: 07/08/2023 Discharge date: 07/15/2023  Discharge Diagnoses Patient Active Problem List   Diagnosis Date Noted   Pneumoperitoneum 07/08/2023   Abdominal pain 07/08/2023    Consultants None  Procedures 07/08/2023: Exploratory Laparotomy Small Bowel Resection Repair of Incarcerated Ventral Hernia    HPI: 67 y.o. female presented to [facility] ED today for abdominal pain. Patient reports the acute onset of severe abdominal pain, centrally, yesterday afternoon. This was severe in nature. She took tylenol with minimal relief put the pain persisted throughout the night. She has felt nauseous. No fever, chills, emesis, or bowel changes. She has reported continuing to pass gas. She does report a previous history of ulcers; not taking any BC powders, NSAIDs. She does have a significant previous surgical history including multiple hiatal hernia repairs, bilateral inguinal hernia repairs, cholecystectomy. Work up in the ED revealed a mild leukocytosis to 11.7K, Hgb 14.6, sCr - 0.95, no electrolyte derangements, lipase normal at 24. CT Abdomen/pelvis was concerning for pneumoperitoneum, no clear source.   Hospital Course: Informed consent was obtained and documented, and patient underwent uneventful exploratory laparotomy with small bowel resection and repair of incarcerated ventral hernia (Dr Everlene Farrier, 07/08/2023).  Post-operatively, patient initially did well. Unfortunately, she did develop an ileus on POD2. She did not require TPN nor NGT decompression. She would ultimately have adequate return of bowel function. Advancement of patient's diet and ambulation were well-tolerated. The remainder of patient's hospital course was essentially unremarkable, and discharge planning was initiated accordingly with patient safely able to be discharged home with appropriate  discharge instructions, pain control, and outpatient follow-up after all of her questions were answered to her expressed satisfaction.   Pathology reviewed:  FINAL DIAGNOSIS        1. Small Bowel,  :       - SEGMENT OF SMALL BOWEL WITH ACUTE SEROSITIS, DIVERTICULITIS AND TRANSMURAL       NECROSIS CONSISTENT WITH CLINICAL HISTORY OF PERFORATION       - MARGINS APPEAR VIABLE AND FREE OF SIGNIFICANT INFLAMMATION       - NEGATIVE FOR MALIGNANCY     Discharge Condition: Good   Physical Examination:  Constitutional: alert, cooperative and no distress  Respiratory: breathing non-labored at rest  Cardiovascular: regular rate and sinus rhythm  Gastrointestinal: soft, incisional soreness expectedly, and non-distended. No rebound/guarding Integumentary: Laparotomy is CDI with staples; ecchymotic expectedly, no erythema    Allergies as of 07/15/2023       Reactions   Hydrocodone Nausea And Vomiting        Medication List     TAKE these medications    acetaminophen 325 MG tablet Commonly known as: TYLENOL Take 2 tablets (650 mg total) by mouth 2 (two) times daily as needed for moderate pain or fever.   cetirizine 10 MG tablet Commonly known as: ZYRTEC Take 10 mg by mouth daily as needed for allergies.   Creon 36000-114000 units Cpep capsule Generic drug: lipase/protease/amylase Take 36,000 Units by mouth 3 (three) times daily.   cyanocobalamin 1000 MCG tablet Commonly known as: VITAMIN B12 Take by mouth.   GARLIC PO Take by mouth.   ketoconazole 2 % shampoo Commonly known as: NIZORAL Shampoo into scalp let sit 5-10 minutes then wash out. Use 3 days per week.   lisinopril 10 MG tablet Commonly known as: ZESTRIL Take 5 mg by mouth daily.   lisinopril 5 MG  tablet Commonly known as: ZESTRIL Take 5 mg by mouth daily.   metoprolol succinate 25 MG 24 hr tablet Commonly known as: TOPROL-XL Take 1 tablet by mouth daily.   metoprolol tartrate 25 MG tablet Commonly  known as: LOPRESSOR Take 25 mg by mouth daily.   mometasone 0.1 % lotion Commonly known as: ELOCON Apply to itchy areas of scalp qd up to 5 days per week prn itching   ondansetron 4 MG disintegrating tablet Commonly known as: ZOFRAN-ODT Take 4 mg by mouth every 8 (eight) hours as needed for nausea or vomiting.   ondansetron 4 MG tablet Commonly known as: ZOFRAN Take 4 mg by mouth every 8 (eight) hours as needed for nausea.   oxyCODONE 5 MG immediate release tablet Commonly known as: Oxy IR/ROXICODONE Take 1 tablet (5 mg total) by mouth every 6 (six) hours as needed for severe pain (pain score 7-10) or breakthrough pain.   SUMAtriptan 100 MG tablet Commonly known as: IMITREX TAKE 1 TABLET ONCE DAILY AS NEEDED FOR MIGRAINE HEADACHE.  MAY TAKE A SECOND DOSE AFTER 2 HOURS IF NEEDED   topiramate 100 MG tablet Commonly known as: TOPAMAX Take 100 mg by mouth 2 (two) times daily.   Vitamin D-1000 Max St 25 MCG (1000 UT) tablet Generic drug: Cholecalciferol Take by mouth.       ASK your doctor about these medications    fluconazole 150 MG tablet Commonly known as: Diflucan Take 1 tablet (150 mg total) by mouth once for 1 dose. Ask about: Should I take this medication?               Durable Medical Equipment  (From admission, onward)           Start     Ordered   07/11/23 0747  For home use only DME Walker rolling  Once       Question Answer Comment  Walker: With 5 Inch Wheels   Patient needs a walker to treat with the following condition Physical deconditioning      07/11/23 0747              Follow-up Information     Leafy Ro, MD. Go on 07/28/2023.   Specialty: General Surgery Why: Go to appointment on 11/04 at 1130 AM Contact information: [address] [place] [address] [phone]         [facility], [facility] Follow up.   Specialty: Home Health Services Why: They will follow up with you for your home  health therapy needs. Contact information: [address] [place] [address] [phone]                  Time spent on discharge management including discussion of hospital course, clinical condition, outpatient instructions, prescriptions, and follow up with the patient and members of the medical team: >30 minutes  -- Lynden Oxford , PA-C [facility]  07/15/2023, 7:38 AM [phone] M-F: 7am - 4pm

## 2023-07-15 NOTE — Plan of Care (Signed)
The patient has been discharged. IV has been removed. Education has been completed with the patient.  Problem: Education: Goal: Knowledge of General Education information will improve Description: Including pain rating scale, medication(s)/side effects and non-pharmacologic comfort measures Outcome: Completed/Met   Problem: Health Behavior/Discharge Planning: Goal: Ability to manage health-related needs will improve Outcome: Completed/Met   Problem: Clinical Measurements: Goal: Ability to maintain clinical measurements within normal limits will improve Outcome: Completed/Met Goal: Will remain free from infection Outcome: Completed/Met Goal: Diagnostic test results will improve Outcome: Completed/Met Goal: Respiratory complications will improve Outcome: Completed/Met Goal: Cardiovascular complication will be avoided Outcome: Completed/Met   Problem: Activity: Goal: Risk for activity intolerance will decrease Outcome: Completed/Met   Problem: Nutrition: Goal: Adequate nutrition will be maintained Outcome: Completed/Met   Problem: Coping: Goal: Level of anxiety will decrease Outcome: Completed/Met   Problem: Elimination: Goal: Will not experience complications related to bowel motility Outcome: Completed/Met Goal: Will not experience complications related to urinary retention Outcome: Completed/Met   Problem: Pain Managment: Goal: General experience of comfort will improve Outcome: Completed/Met   Problem: Safety: Goal: Ability to remain free from injury will improve Outcome: Completed/Met   Problem: Skin Integrity: Goal: Risk for impaired skin integrity will decrease Outcome: Completed/Met   Problem: Acute Rehab PT Goals(only PT should resolve) Goal: Pt Will Go Sit To Supine/Side Outcome: Completed/Met Goal: Patient Will Transfer Sit To/From Stand Outcome: Completed/Met Goal: Pt Will Ambulate Outcome: Completed/Met

## 2023-07-15 NOTE — TOC Transition Note (Signed)
Transition of Care [facility]) - CM/SW Discharge Note   Patient Details  Name: Mia Gray MRN: [id number] Date of Birth: 03/13/1956  Transition of Care [facility]) CM/SW Contact:  Truddie Hidden, RN Phone Number: 07/15/2023, 9:47 AM   Clinical Narrative:    Spoke with patient regarding her discharge home today. She stated her ride will be coming to get her around 11:30am She was advised [facility] would be contacting her regarding scheduling for her [facility].  TOC signing off.        Final next level of care: Home w Home Health Services Barriers to Discharge: Barriers Resolved   Patient Goals and CMS Choice CMS Medicare.gov Compare Post Acute Care list provided to:: Patient Choice offered to / list presented to : Patient  Discharge Placement                  Patient to be transferred to facility by: Family   Patient and family notified of of transfer: 07/11/23  Discharge Plan and Services Additional resources added to the After Visit Summary for       Post Acute Care Choice:  (TBD)          DME Arranged: Dan Humphreys rolling DME Agency: [employer] Date DME Agency Contacted: 07/11/23   Representative spoke with at DME Agency: Yvone Neu HH Arranged: PT, OT [facility] Agency: [facility] Date [facility] Agency Contacted: 07/11/23   Representative spoke with at [facility] Agency: Lorenza Chick  Social Determinants of Health (SDOH) Interventions SDOH Screenings   Food Insecurity: No Food Insecurity (07/08/2023)  Housing: Low Risk  (07/08/2023)  Transportation Needs: No Transportation Needs (07/08/2023)  Utilities: Not At Risk (07/08/2023)  Tobacco Use: Low Risk  (07/08/2023)     Readmission Risk Interventions     No data to display

## 2023-07-21 ENCOUNTER — Encounter: Payer: 59 | Admitting: Surgery

## 2023-07-28 ENCOUNTER — Ambulatory Visit (INDEPENDENT_AMBULATORY_CARE_PROVIDER_SITE_OTHER): Payer: 59 | Admitting: Surgery

## 2023-07-28 ENCOUNTER — Encounter: Payer: Self-pay | Admitting: Surgery

## 2023-07-28 VITALS — BP 118/74 | HR 102 | Temp 98.5°F | Ht 60.0 in | Wt 145.8 lb

## 2023-07-28 DIAGNOSIS — K5700 Diverticulitis of small intestine with perforation and abscess without bleeding: Secondary | ICD-10-CM

## 2023-07-28 DIAGNOSIS — Z09 Encounter for follow-up examination after completed treatment for conditions other than malignant neoplasm: Secondary | ICD-10-CM

## 2023-07-28 NOTE — Patient Instructions (Signed)
If you have any concerns or questions, please feel free to call our office.   Exploratory Laparotomy, Adult, Care After After exploratory laparotomy, it's common to have pain, tiredness, bloating, and gas. You may also have no desire to eat food. Follow these instructions at home: Medicines Take over-the-counter and prescription medicines only as told by your health care provider. Take your antibiotics as told by your provider. Do not stop taking your antibiotics even if you start to feel better. Ask your provider if the medicine prescribed to you: Requires you to avoid driving or using machinery. Can cause constipation, or trouble pooping. You may need to take these actions to prevent or treat pooping problems: Drink enough fluid to keep your pee (urine) pale yellow. Take over-the-counter or prescription medicines. Eat foods that are high in fiber, such as beans, whole grains, and fresh fruits and vegetables. Limit foods that are high in fat and processed sugars, such as fried or sweet foods. Incision care  Follow instructions from your provider about how to take care of your incision, or the cut in your belly. Make sure you: Wash your hands with soap and water for at least 20 seconds before and after you change your bandage. If soap and water are not available, use hand sanitizer. Change your bandage as told by your provider. Leave stitches, skin glue, or tape strips in place. These skin closures may need to stay in place for 2 weeks or longer. If tape strip edges start to loosen and curl up, you may trim the loose edges. Do not remove tape strips completely unless your provider tells you to do that. If you were sent home with a drain, follow instructions from your provider about how to care for it. Check the cut in your belly every day for signs of infection. Check for: Redness, swelling, or more pain. Fluid or blood. Warmth. Pus or a bad smell. Activity  Rest as told by your  provider. Do not sit for a long time without moving. Get up to take short walks every 1-2 hours. This will improve blood flow and breathing. Ask for help if you feel weak or unsteady. You may have to avoid lifting. Ask your provider how much you can safely lift. Return to your normal activities as told by your provider. Ask your provider what activities are safe for you. General instructions Do not use any products that contain nicotine or tobacco. These products include cigarettes, chewing tobacco, and vaping devices, such as e-cigarettes. These can delay incision healing after surgery. If you need help quitting, ask your provider. Do not take baths, swim, or use a hot tub until your provider approves. Ask your provider if you may take showers. You may only be allowed to take sponge baths. Keep all follow-up visits. Your provider will check for problems and make sure you are healing. Your provider may give you more instructions. Make sure you know what you can and can't do. Contact a health care provider if: You have a fever or chills. Your pain is getting worse or your pain medicine is not helping. You vomit or feel like you may vomit. You have watery poop. You have trouble pooping or have not had a bowel movement for more than 3 days. You have signs of infection around the cut that was made in your belly. Get help right away if: The cut in your belly opens up. You have warmth, tenderness, or swelling in your calf. You have trouble breathing. You  have chest pain. These symptoms may be an emergency. Get help right away. Call 911. Do not wait to see if the symptoms will go away. Do not drive yourself to the hospital. This information is not intended to replace advice given to you by your health care provider. Make sure you discuss any questions you have with your health care provider. Document Revised: 11/27/2022 Document Reviewed: 11/27/2022 Elsevier Patient Education  2024 [employer].

## 2023-07-29 NOTE — Progress Notes (Signed)
  Surgical Consultation    Mia Gray is an 67 y.o. female.   Chief Complaint  Patient presents with   Routine Post Op    Exploratory laparotomy SBO 07/08/2023    HPI: Jacqualin is 2 and half weeks after small bowel resection for perforated small bowel diverticulitis.  He has some intermittent pain.  Tolerating diet and having bowel movements.  Overall doing okay.  Past Medical History:  Diagnosis Date   Actinic keratosis    Bronchitis    Hiatal hernia    Hypertension    Melanoma (HCC) 10/21/2017   Right posterior thigh. MM, tumor thickness 0.63mm, Anatomic level III. Excised: 01/06/2018, margins free.   Migraine     Past Surgical History:  Procedure Laterality Date   BREAST BIOPSY Right 10/15/2016   CALCIFICATIONS WITHIN SCATTERED MICROCYSTS AND INVOLUTED LOBULES.   BREAST CYST ASPIRATION Right 10+ yrs ago   negative   BREAST SURGERY Right    lumpectomy   CHOLECYSTECTOMY     [facility]   COLONOSCOPY WITH PROPOFOL N/A 07/15/2017   Procedure: COLONOSCOPY WITH PROPOFOL;  Surgeon: Christena Deem, MD;  Location: [facility] ENDOSCOPY;  Service: Endoscopy;  Laterality: N/A;   COLONOSCOPY WITH PROPOFOL N/A 05/29/2022   Procedure: COLONOSCOPY WITH PROPOFOL;  Surgeon: Toledo, Boykin Nearing, MD;  Location: [facility] ENDOSCOPY;  Service: Gastroenterology;  Laterality: N/A;   ESOPHAGOGASTRODUODENOSCOPY (EGD) WITH PROPOFOL N/A 07/15/2017   Procedure: ESOPHAGOGASTRODUODENOSCOPY (EGD) WITH PROPOFOL;  Surgeon: Christena Deem, MD;  Location: [facility] ENDOSCOPY;  Service: Endoscopy;  Laterality: N/A;   haital hernia     HERNIA REPAIR Right    x 2   HERNIA REPAIR Left    [place]   LAPAROTOMY N/A 07/08/2023   Procedure: EXPLORATORY LAPAROTOMY WITH SMALL BOWEL RESECTION;  Surgeon: Leafy Ro, MD;  Location: [facility] ORS;  Service: General;  Laterality: N/A;   VENTRAL HERNIA REPAIR N/A 07/08/2023   Procedure: HERNIA REPAIR VENTRAL ADULT;  Surgeon: Leafy Ro, MD;  Location: [facility] ORS;  Service:  General;  Laterality: N/A;    Family History  Problem Relation Age of Onset   Cancer Mother        breast and colon   Stroke Mother    Breast cancer Mother     Social History:  reports that she has never smoked. She has never used smokeless tobacco. She reports that she does not drink alcohol and does not use drugs.  Allergies:  Allergies  Allergen Reactions   Hydrocodone Nausea And Vomiting    Medications reviewed.     ROS Full ROS performed and is otherwise negative other than what is stated in the HPI    BP 118/74 (BP Location: Left Arm, Patient Position: Sitting, Cuff Size: Small)   Pulse (!) 102   Temp 98.5 F (36.9 C) (Oral)   Ht 5' (1.524 m)   Wt 145 lb 12.8 oz (66.1 kg)   SpO2 95%   BMI 28.47 kg/m   Physical Exam NAD alert ABd: soft, staples removed, no infection, no peritonitis, mild appropriate incisional tenderness  Assessment/Plan: Doing well after small bowel resection.  Pathology discussed with the patient in detail.  No evidence of complication.  Like to see her back in a couple weeks or so for a wound check   Sterling Big, MD [facility] General Surgeon

## 2023-08-20 ENCOUNTER — Ambulatory Visit: Payer: 59 | Admitting: Physician Assistant

## 2023-08-20 ENCOUNTER — Encounter: Payer: Self-pay | Admitting: Physician Assistant

## 2023-08-20 VITALS — BP 93/67 | HR 98 | Temp 98.2°F | Ht 60.0 in | Wt 144.0 lb

## 2023-08-20 DIAGNOSIS — R1084 Generalized abdominal pain: Secondary | ICD-10-CM

## 2023-08-20 DIAGNOSIS — Z09 Encounter for follow-up examination after completed treatment for conditions other than malignant neoplasm: Secondary | ICD-10-CM

## 2023-08-20 DIAGNOSIS — K5700 Diverticulitis of small intestine with perforation and abscess without bleeding: Secondary | ICD-10-CM

## 2023-08-20 DIAGNOSIS — K668 Other specified disorders of peritoneum: Secondary | ICD-10-CM

## 2023-08-20 NOTE — Progress Notes (Signed)
[facility] POST-OP OFFICE VISIT  08/20/2023  HPI: Mia Gray is a 67 y.o. female 6 weeks s/p exploratory laparotomy and small bowel resection with repair of incarcerated ventral hernia with Dr Everlene Farrier   Overall she is doing well She still has some LUQ abdominal pain with eating; this was present prior to surgery and for many years. Otherwise abdomen is non-tender Some nausea occasionally  No fever, chills, emesis She is ambulating independently; taking it easy Incision is healed She is having bowel function; occasional diarrhea - on Creon   Vital signs: BP 93/67   Pulse 98   Temp 98.2 F (36.8 C) (Oral)   Ht 5' (1.524 m)   Wt 144 lb (65.3 kg)   SpO2 98%   BMI 28.12 kg/m    Physical Exam: Constitutional: Well appearing female, NAD Abdomen: Soft, non-tender, non-distended, no rebound/guarding Skin: Laparotomy is well healed, no erythema or drainage   Assessment/Plan: This is a 67 y.o. female 6 weeks s/p exploratory laparotomy and small bowel resection with repair of incarcerated ventral hernia with Dr Everlene Farrier    - Overall, she is doing well from a surgical perspective. I wonder if some of her pain with eating is secondary to her numerous hiatal hernia repairs. No evidence of ulcers. No evidence of surgical complication.   - Pain control prn  - Reviewed wound care recommendation  - Reviewed lifting restrictions; she has completed these   - We will see her again in ~3 months for recheck; She understands to call with questions/concerns in the interim  -- Lynden Oxford, PA-C [facility] 08/20/2023, 1:28 PM M-F: 7am - 4pm

## 2023-08-20 NOTE — Patient Instructions (Signed)
Exploratory Laparotomy, Adult, Care After After exploratory laparotomy, it's common to have pain, tiredness, bloating, and gas. You may also have no desire to eat food. Follow these instructions at home: Medicines Take over-the-counter and prescription medicines only as told by your health care provider. Take your antibiotics as told by your provider. Do not stop taking your antibiotics even if you start to feel better. Ask your provider if the medicine prescribed to you: Requires you to avoid driving or using machinery. Can cause constipation, or trouble pooping. You may need to take these actions to prevent or treat pooping problems: Drink enough fluid to keep your pee (urine) pale yellow. Take over-the-counter or prescription medicines. Eat foods that are high in fiber, such as beans, whole grains, and fresh fruits and vegetables. Limit foods that are high in fat and processed sugars, such as fried or sweet foods. Incision care  Follow instructions from your provider about how to take care of your incision, or the cut in your belly. Make sure you: Wash your hands with soap and water for at least 20 seconds before and after you change your bandage. If soap and water are not available, use hand sanitizer. Change your bandage as told by your provider. Leave stitches, skin glue, or tape strips in place. These skin closures may need to stay in place for 2 weeks or longer. If tape strip edges start to loosen and curl up, you may trim the loose edges. Do not remove tape strips completely unless your provider tells you to do that. If you were sent home with a drain, follow instructions from your provider about how to care for it. Check the cut in your belly every day for signs of infection. Check for: Redness, swelling, or more pain. Fluid or blood. Warmth. Pus or a bad smell. Activity  Rest as told by your provider. Do not sit for a long time without moving. Get up to take short walks every  1-2 hours. This will improve blood flow and breathing. Ask for help if you feel weak or unsteady. You may have to avoid lifting. Ask your provider how much you can safely lift. Return to your normal activities as told by your provider. Ask your provider what activities are safe for you. General instructions Do not use any products that contain nicotine or tobacco. These products include cigarettes, chewing tobacco, and vaping devices, such as e-cigarettes. These can delay incision healing after surgery. If you need help quitting, ask your provider. Do not take baths, swim, or use a hot tub until your provider approves. Ask your provider if you may take showers. You may only be allowed to take sponge baths. Keep all follow-up visits. Your provider will check for problems and make sure you are healing. Your provider may give you more instructions. Make sure you know what you can and can't do. Contact a health care provider if: You have a fever or chills. Your pain is getting worse or your pain medicine is not helping. You vomit or feel like you may vomit. You have watery poop. You have trouble pooping or have not had a bowel movement for more than 3 days. You have signs of infection around the cut that was made in your belly. Get help right away if: The cut in your belly opens up. You have warmth, tenderness, or swelling in your calf. You have trouble breathing. You have chest pain. These symptoms may be an emergency. Get help right away. Call 911. Do  not wait to see if the symptoms will go away. Do not drive yourself to the hospital. This information is not intended to replace advice given to you by your health care provider. Make sure you discuss any questions you have with your health care provider. Document Revised: 11/27/2022 Document Reviewed: 11/27/2022 Elsevier Patient Education  2024 [employer].

## 2023-11-12 ENCOUNTER — Ambulatory Visit (INDEPENDENT_AMBULATORY_CARE_PROVIDER_SITE_OTHER): Payer: 59 | Admitting: Dermatology

## 2023-11-12 DIAGNOSIS — L570 Actinic keratosis: Secondary | ICD-10-CM | POA: Diagnosis not present

## 2023-11-12 DIAGNOSIS — L814 Other melanin hyperpigmentation: Secondary | ICD-10-CM

## 2023-11-12 DIAGNOSIS — L578 Other skin changes due to chronic exposure to nonionizing radiation: Secondary | ICD-10-CM

## 2023-11-12 DIAGNOSIS — L821 Other seborrheic keratosis: Secondary | ICD-10-CM | POA: Diagnosis not present

## 2023-11-12 DIAGNOSIS — W098XXA Fall on or from other playground equipment, initial encounter: Secondary | ICD-10-CM

## 2023-11-12 DIAGNOSIS — L820 Inflamed seborrheic keratosis: Secondary | ICD-10-CM | POA: Diagnosis not present

## 2023-11-12 DIAGNOSIS — D229 Melanocytic nevi, unspecified: Secondary | ICD-10-CM

## 2023-11-12 NOTE — Patient Instructions (Addendum)

## 2023-11-12 NOTE — Progress Notes (Unsigned)
 Follow-Up Visit   Subjective  Mia Gray is a 68 y.o. female who presents for the following: AK f/u R infraorbital, 52m f/u, check spots hands, irritating The patient has spots, moles and lesions to be evaluated, some may be new or changing and the patient may have concern these could be cancer.  The following portions of the chart were reviewed this encounter and updated as appropriate: medications, allergies, medical history  Review of Systems:  No other skin or systemic complaints except as noted in HPI or Assessment and Plan.  Objective  Well appearing patient in no apparent distress; mood and affect are within normal limits.  A focused examination was performed of the following areas: face Relevant exam findings are noted in the Assessment and Plan.  R hand dorsum x 2, L hand dorsum x 1 (3) Stuck on waxy paps with erythema R medial infraorbital x 1, nose x 2 (3) Pink scaly macules  Assessment & Plan   ACTINIC DAMAGE - chronic, secondary to cumulative UV radiation exposure/sun exposure over time - diffuse scaly erythematous macules with underlying dyspigmentation - Recommend daily broad spectrum sunscreen SPF 30+ to sun-exposed areas, reapply every 2 hours as needed.  - Recommend staying in the shade or wearing long sleeves, sun glasses (UVA+UVB protection) and wide brim hats (4-inch brim around the entire circumference of the hat). - Call for new or changing lesions.   LENTIGINES Exam: scattered tan macules Due to sun exposure Treatment Plan: Benign-appearing, observe. Recommend daily broad spectrum sunscreen SPF 30+ to sun-exposed areas, reapply every 2 hours as needed.  Call for any changes  SEBORRHEIC KERATOSIS - Stuck-on, waxy, tan-brown papules and/or plaques  - Benign-appearing - Discussed benign etiology and prognosis. - Observe - Call for any changes  INFLAMED SEBORRHEIC KERATOSIS (3) R hand dorsum x 2, L hand dorsum x 1 (3) Symptomatic, irritating,  patient would like treated. Destruction of lesion - R hand dorsum x 2, L hand dorsum x 1 (3) Complexity: simple   Destruction method: cryotherapy   Informed consent: discussed and consent obtained   Timeout:  patient name, date of birth, surgical site, and procedure verified Lesion destroyed using liquid nitrogen: Yes   Region frozen until ice ball extended beyond lesion: Yes   Outcome: patient tolerated procedure well with no complications   Post-procedure details: wound care instructions given   AK (ACTINIC KERATOSIS) (3) R medial infraorbital x 1, nose x 2 (3) Actinic keratoses are precancerous spots that appear secondary to cumulative UV radiation exposure/sun exposure over time. They are chronic with expected duration over 1 year. A portion of actinic keratoses will progress to squamous cell carcinoma of the skin. It is not possible to reliably predict which spots will progress to skin cancer and so treatment is recommended to prevent development of skin cancer.  Recommend daily broad spectrum sunscreen SPF 30+ to sun-exposed areas, reapply every 2 hours as needed.  Recommend staying in the shade or wearing long sleeves, sun glasses (UVA+UVB protection) and wide brim hats (4-inch brim around the entire circumference of the hat). Call for new or changing lesions. Destruction of lesion - R medial infraorbital x 1, nose x 2 (3) Complexity: simple   Destruction method: cryotherapy   Informed consent: discussed and consent obtained   Timeout:  patient name, date of birth, surgical site, and procedure verified Lesion destroyed using liquid nitrogen: Yes   Region frozen until ice ball extended beyond lesion: Yes   Outcome: patient tolerated procedure  well with no complications   Post-procedure details: wound care instructions given    Return for as scheduled for TBSE, Hx of Melanoma, Hx of AKs.  I, Ardis Rowan, RMA, am acting as scribe for Armida Sans, MD .   Documentation: I have  reviewed the above documentation for accuracy and completeness, and I agree with the above.  Armida Sans, MD

## 2023-11-13 ENCOUNTER — Encounter: Payer: Self-pay | Admitting: Dermatology

## 2023-11-19 ENCOUNTER — Ambulatory Visit (INDEPENDENT_AMBULATORY_CARE_PROVIDER_SITE_OTHER): Payer: 59 | Admitting: Surgery

## 2023-11-19 ENCOUNTER — Encounter: Payer: Self-pay | Admitting: Surgery

## 2023-11-19 VITALS — BP 103/69 | HR 63 | Temp 98.0°F | Ht 60.0 in | Wt 136.8 lb

## 2023-11-19 DIAGNOSIS — R109 Unspecified abdominal pain: Secondary | ICD-10-CM

## 2023-11-19 DIAGNOSIS — Z09 Encounter for follow-up examination after completed treatment for conditions other than malignant neoplasm: Secondary | ICD-10-CM | POA: Diagnosis not present

## 2023-11-19 NOTE — Patient Instructions (Signed)
 Exploratory Laparotomy, Adult, Care After After exploratory laparotomy, it's common to have pain, tiredness, bloating, and gas. You may also have no desire to eat food. Follow these instructions at home: Medicines Take over-the-counter and prescription medicines only as told by your health care provider. Take your antibiotics as told by your provider. Do not stop taking your antibiotics even if you start to feel better. Ask your provider if the medicine prescribed to you: Requires you to avoid driving or using machinery. Can cause constipation, or trouble pooping. You may need to take these actions to prevent or treat pooping problems: Drink enough fluid to keep your pee (urine) pale yellow. Take over-the-counter or prescription medicines. Eat foods that are high in fiber, such as beans, whole grains, and fresh fruits and vegetables. Limit foods that are high in fat and processed sugars, such as fried or sweet foods. Incision care  Follow instructions from your provider about how to take care of your incision, or the cut in your belly. Make sure you: Wash your hands with soap and water for at least 20 seconds before and after you change your bandage. If soap and water are not available, use hand sanitizer. Change your bandage as told by your provider. Leave stitches, skin glue, or tape strips in place. These skin closures may need to stay in place for 2 weeks or longer. If tape strip edges start to loosen and curl up, you may trim the loose edges. Do not remove tape strips completely unless your provider tells you to do that. If you were sent home with a drain, follow instructions from your provider about how to care for it. Check the cut in your belly every day for signs of infection. Check for: Redness, swelling, or more pain. Fluid or blood. Warmth. Pus or a bad smell. Activity  Rest as told by your provider. Do not sit for a long time without moving. Get up to take short walks every  1-2 hours. This will improve blood flow and breathing. Ask for help if you feel weak or unsteady. You may have to avoid lifting. Ask your provider how much you can safely lift. Return to your normal activities as told by your provider. Ask your provider what activities are safe for you. General instructions Do not use any products that contain nicotine or tobacco. These products include cigarettes, chewing tobacco, and vaping devices, such as e-cigarettes. These can delay incision healing after surgery. If you need help quitting, ask your provider. Do not take baths, swim, or use a hot tub until your provider approves. Ask your provider if you may take showers. You may only be allowed to take sponge baths. Keep all follow-up visits. Your provider will check for problems and make sure you are healing. Your provider may give you more instructions. Make sure you know what you can and can't do. Contact a health care provider if: You have a fever or chills. Your pain is getting worse or your pain medicine is not helping. You vomit or feel like you may vomit. You have watery poop. You have trouble pooping or have not had a bowel movement for more than 3 days. You have signs of infection around the cut that was made in your belly. Get help right away if: The cut in your belly opens up. You have warmth, tenderness, or swelling in your calf. You have trouble breathing. You have chest pain. These symptoms may be an emergency. Get help right away. Call 911. Do  not wait to see if the symptoms will go away. Do not drive yourself to the hospital. This information is not intended to replace advice given to you by your health care provider. Make sure you discuss any questions you have with your health care provider. Document Revised: 11/27/2022 Document Reviewed: 11/27/2022 Elsevier Patient Education  2024 [employer].

## 2023-11-19 NOTE — Progress Notes (Signed)
 Outpatient Surgical Follow Up  11/19/2023  Mia Gray is an 68 y.o. female.   Chief Complaint  Patient presents with   Follow-up    Exploratory laparotomy, SBO    HPI: Mia Gray is 4 months out from his small bowel resection due to perforated diverticulitis of the small bowel.  She is doing okay she continues to have functional GI issues with intermittent abdominal pain that is mild to moderate and colicky.  She also has intermittent episode of constipation and diarrhea  Past Medical History:  Diagnosis Date   Actinic keratosis    Bronchitis    Hiatal hernia    Hypertension    Melanoma (HCC) 10/21/2017   Right posterior thigh. MM, tumor thickness 0.24mm, Anatomic level III. Excised: 01/06/2018, margins free.   Migraine     Past Surgical History:  Procedure Laterality Date   BREAST BIOPSY Right 10/15/2016   CALCIFICATIONS WITHIN SCATTERED MICROCYSTS AND INVOLUTED LOBULES.   BREAST CYST ASPIRATION Right 10+ yrs ago   negative   BREAST SURGERY Right    lumpectomy   CHOLECYSTECTOMY     [facility]   COLONOSCOPY WITH PROPOFOL N/A 07/15/2017   Procedure: COLONOSCOPY WITH PROPOFOL;  Surgeon: Christena Deem, MD;  Location: [facility] ENDOSCOPY;  Service: Endoscopy;  Laterality: N/A;   COLONOSCOPY WITH PROPOFOL N/A 05/29/2022   Procedure: COLONOSCOPY WITH PROPOFOL;  Surgeon: Toledo, Boykin Nearing, MD;  Location: [facility] ENDOSCOPY;  Service: Gastroenterology;  Laterality: N/A;   ESOPHAGOGASTRODUODENOSCOPY (EGD) WITH PROPOFOL N/A 07/15/2017   Procedure: ESOPHAGOGASTRODUODENOSCOPY (EGD) WITH PROPOFOL;  Surgeon: Christena Deem, MD;  Location: [facility] ENDOSCOPY;  Service: Endoscopy;  Laterality: N/A;   haital hernia     HERNIA REPAIR Right    x 2   HERNIA REPAIR Left    [place]   LAPAROTOMY N/A 07/08/2023   Procedure: EXPLORATORY LAPAROTOMY WITH SMALL BOWEL RESECTION;  Surgeon: Leafy Ro, MD;  Location: [facility] ORS;  Service: General;  Laterality: N/A;   VENTRAL HERNIA REPAIR N/A 07/08/2023    Procedure: HERNIA REPAIR VENTRAL ADULT;  Surgeon: Leafy Ro, MD;  Location: [facility] ORS;  Service: General;  Laterality: N/A;    Family History  Problem Relation Age of Onset   Cancer Mother        breast and colon   Stroke Mother    Breast cancer Mother     Social History:  reports that she has never smoked. She has never used smokeless tobacco. She reports that she does not drink alcohol and does not use drugs.  Allergies:  Allergies  Allergen Reactions   Hydrocodone Nausea And Vomiting    Medications reviewed.    ROS Full ROS performed and is otherwise negative other than what is stated in HPI   BP 103/69   Pulse 63   Temp 98 F (36.7 C) (Oral)   Ht 5' (1.524 m)   Wt 136 lb 12.8 oz (62.1 kg)   SpO2 98%   BMI 26.72 kg/m   Physical Exam Vitals and nursing note reviewed. Exam conducted with a chaperone present.  Constitutional:      Appearance: Normal appearance. She is not ill-appearing.  Cardiovascular:     Rate and Rhythm: Normal rate and regular rhythm.  Pulmonary:     Effort: Pulmonary effort is normal.     Breath sounds: Normal breath sounds.  Abdominal:     General: Abdomen is flat. There is no distension.     Palpations: Abdomen is soft. There is no mass.  Tenderness: There is no abdominal tenderness.     Hernia: No hernia is present.     Comments: Laparotomy scar healed  Skin:    General: Skin is warm and dry.     Capillary Refill: Capillary refill takes less than 2 seconds.  Neurological:     General: No focal deficit present.     Mental Status: She is alert and oriented to person, place, and time.  Psychiatric:        Mood and Affect: Mood normal.        Behavior: Behavior normal.        Thought Content: Thought content normal.        Judgment: Judgment normal.    Assessment/Plan: 68 year old female doing very well after small bowel resection.  She does have chronic abdominal pain and chronic abdominal GI issues We will refer back to  GI We will follow prn basis Advice her about fiber supplementation   Sterling Big, MD [facility] General Surgeon

## 2024-03-02 ENCOUNTER — Other Ambulatory Visit: Payer: Self-pay | Admitting: Internal Medicine

## 2024-03-02 DIAGNOSIS — Z1231 Encounter for screening mammogram for malignant neoplasm of breast: Secondary | ICD-10-CM

## 2024-04-09 ENCOUNTER — Ambulatory Visit
Admission: RE | Admit: 2024-04-09 | Discharge: 2024-04-09 | Disposition: A | Discharge status: Home / Self Care | Source: Ambulatory Visit | Attending: Internal Medicine | Admitting: Internal Medicine

## 2024-04-09 DIAGNOSIS — Z1231 Encounter for screening mammogram for malignant neoplasm of breast: Secondary | ICD-10-CM | POA: Diagnosis present

## 2024-05-06 ENCOUNTER — Other Ambulatory Visit: Payer: Self-pay | Admitting: Gastroenterology

## 2024-05-06 ENCOUNTER — Ambulatory Visit
Admission: RE | Admit: 2024-05-06 | Discharge: 2024-05-06 | Disposition: A | Discharge status: Home / Self Care | Source: Ambulatory Visit | Attending: Gastroenterology | Admitting: Gastroenterology

## 2024-05-06 DIAGNOSIS — R110 Nausea: Secondary | ICD-10-CM | POA: Diagnosis present

## 2024-05-06 DIAGNOSIS — R1013 Epigastric pain: Secondary | ICD-10-CM | POA: Insufficient documentation

## 2024-05-06 DIAGNOSIS — Z9049 Acquired absence of other specified parts of digestive tract: Secondary | ICD-10-CM | POA: Diagnosis present

## 2024-05-06 MED ORDER — IOHEXOL 300 MG/ML  SOLN
100.0000 mL | Freq: Once | INTRAMUSCULAR | Status: AC | PRN
  Administered 2024-05-06: 100 mL via INTRAVENOUS

## 2024-05-25 ENCOUNTER — Encounter: Payer: Self-pay | Admitting: Internal Medicine

## 2024-05-26 ENCOUNTER — Ambulatory Visit: Payer: 59 | Admitting: Dermatology

## 2024-05-31 ENCOUNTER — Encounter: Payer: Self-pay | Admitting: Internal Medicine

## 2024-06-09 ENCOUNTER — Ambulatory Visit

## 2024-06-09 ENCOUNTER — Ambulatory Visit
Admission: RE | Admit: 2024-06-09 | Discharge: 2024-06-09 | Disposition: A | Discharge status: Home / Self Care | Attending: Internal Medicine | Admitting: Internal Medicine

## 2024-06-09 ENCOUNTER — Encounter
Admission: RE | Disposition: A | Discharge status: Home / Self Care | Payer: Self-pay | Source: Home / Self Care | Attending: Internal Medicine

## 2024-06-09 ENCOUNTER — Encounter: Payer: Self-pay | Admitting: Internal Medicine

## 2024-06-09 ENCOUNTER — Other Ambulatory Visit: Payer: Self-pay

## 2024-06-09 DIAGNOSIS — R110 Nausea: Secondary | ICD-10-CM | POA: Insufficient documentation

## 2024-06-09 DIAGNOSIS — G43909 Migraine, unspecified, not intractable, without status migrainosus: Secondary | ICD-10-CM | POA: Diagnosis not present

## 2024-06-09 DIAGNOSIS — R1013 Epigastric pain: Secondary | ICD-10-CM | POA: Insufficient documentation

## 2024-06-09 DIAGNOSIS — K2970 Gastritis, unspecified, without bleeding: Secondary | ICD-10-CM | POA: Diagnosis not present

## 2024-06-09 DIAGNOSIS — Z9889 Other specified postprocedural states: Secondary | ICD-10-CM | POA: Insufficient documentation

## 2024-06-09 DIAGNOSIS — G40909 Epilepsy, unspecified, not intractable, without status epilepticus: Secondary | ICD-10-CM | POA: Diagnosis not present

## 2024-06-09 DIAGNOSIS — I10 Essential (primary) hypertension: Secondary | ICD-10-CM | POA: Diagnosis not present

## 2024-06-09 DIAGNOSIS — Z79899 Other long term (current) drug therapy: Secondary | ICD-10-CM | POA: Insufficient documentation

## 2024-06-09 HISTORY — DX: Deficiency of other specified B group vitamins: E53.8

## 2024-06-09 HISTORY — DX: Epigastric pain: R10.13

## 2024-06-09 HISTORY — DX: Other specified diseases of pancreas: K86.89

## 2024-06-09 HISTORY — DX: Epilepsy, unspecified, not intractable, without status epilepticus: G40.909

## 2024-06-09 HISTORY — DX: Vitamin D deficiency, unspecified: E55.9

## 2024-06-09 HISTORY — PX: ESOPHAGOGASTRODUODENOSCOPY: SHX5428

## 2024-06-09 HISTORY — DX: Palpitations: R00.2

## 2024-06-09 SURGERY — EGD (ESOPHAGOGASTRODUODENOSCOPY)
Anesthesia: General

## 2024-06-09 MED ORDER — LIDOCAINE HCL (PF) 2 % IJ SOLN
INTRAMUSCULAR | Status: AC
  Filled 2024-06-09: qty 5

## 2024-06-09 MED ORDER — DEXMEDETOMIDINE HCL IN NACL 80 MCG/20ML IV SOLN
INTRAVENOUS | Status: AC
  Filled 2024-06-09: qty 20

## 2024-06-09 MED ORDER — PROPOFOL 10 MG/ML IV BOLUS
INTRAVENOUS | Status: DC | PRN
  Administered 2024-06-09: 80 mg via INTRAVENOUS
  Administered 2024-06-09 (×2): 20 mg via INTRAVENOUS

## 2024-06-09 MED ORDER — PROPOFOL 10 MG/ML IV BOLUS
INTRAVENOUS | Status: AC
  Filled 2024-06-09: qty 20

## 2024-06-09 MED ORDER — GLYCOPYRROLATE 0.2 MG/ML IJ SOLN
INTRAMUSCULAR | Status: DC | PRN
  Administered 2024-06-09: .2 mg via INTRAVENOUS

## 2024-06-09 MED ORDER — GLYCOPYRROLATE 0.2 MG/ML IJ SOLN
INTRAMUSCULAR | Status: AC
  Filled 2024-06-09: qty 1

## 2024-06-09 MED ORDER — SODIUM CHLORIDE 0.9 % IV SOLN
INTRAVENOUS | Status: DC
  Administered 2024-06-09: 500 mL via INTRAVENOUS

## 2024-06-09 MED ORDER — DEXMEDETOMIDINE HCL IN NACL 80 MCG/20ML IV SOLN
INTRAVENOUS | Status: DC | PRN
  Administered 2024-06-09: 4 ug via INTRAVENOUS

## 2024-06-09 MED ORDER — LIDOCAINE HCL (PF) 2 % IJ SOLN
INTRAMUSCULAR | Status: DC | PRN
  Administered 2024-06-09: 100 mg via INTRADERMAL

## 2024-06-09 NOTE — Transfer of Care (Signed)
 Immediate Anesthesia Transfer of Care Note  Patient: Mia Gray  Procedure(s) Performed: EGD (ESOPHAGOGASTRODUODENOSCOPY)  Patient Location: PACU  Anesthesia Type:General  Level of Consciousness: awake, alert , oriented, and patient cooperative  Airway & Oxygen Therapy: Patient Spontanous Breathing and Patient connected to nasal cannula oxygen  Post-op Assessment: Report given to RN, Post -op Vital signs reviewed and stable, and Patient moving all extremities X 4  Post vital signs: Reviewed and stable  Last Vitals:  Vitals Value Taken Time  BP 115/77 06/09/24 08:48  Temp    Pulse 84 06/09/24 08:50  Resp 15 06/09/24 08:50  SpO2 100 % 06/09/24 08:50  Vitals shown include unfiled device data.  Last Pain:  Vitals:   06/09/24 0848  TempSrc:   PainSc: 0-No pain      Patients Stated Pain Goal: 3 (06/09/24 0724)  Complications: No notable events documented.

## 2024-06-09 NOTE — Anesthesia Preprocedure Evaluation (Signed)
 Anesthesia Evaluation  Patient identified by MRN, date of birth, ID band Patient awake    Reviewed: Allergy & Precautions, NPO status , Patient's Chart, lab work & pertinent test results  History of Anesthesia Complications Negative for: history of anesthetic complications  Airway Mallampati: III  TM Distance: >3 FB Neck ROM: full    Dental  (+) Chipped, Poor Dentition, Missing, Dental Advidsory Given   Pulmonary neg pulmonary ROS   Pulmonary exam normal        Cardiovascular hypertension, On Medications and On Home Beta Blockers (-) angina (-) Past MI and (-) Cardiac Stents Normal cardiovascular exam(-) dysrhythmias (-) Valvular Problems/Murmurs     Neuro/Psych  Headaches, Seizures - (2 in her life), Well Controlled,   negative psych ROS   GI/Hepatic Neg liver ROS, hiatal hernia,GERD  ,,  Endo/Other  negative endocrine ROS    Renal/GU      Musculoskeletal   Abdominal   Peds  Hematology negative hematology ROS (+)   Anesthesia Other Findings Past Medical History: No date: Actinic keratosis No date: Bronchitis No date: Hiatal hernia No date: Hypertension 10/21/2017: Melanoma (HCC)     Comment:  Right posterior thigh. MM, tumor thickness 0.41mm,               Anatomic level III. Excised: 01/06/2018, margins free. No date: Migraine  Past Surgical History: 10/15/2016: BREAST BIOPSY; Right     Comment:  CALCIFICATIONS WITHIN SCATTERED MICROCYSTS AND INVOLUTED              LOBULES. 10+ yrs ago: BREAST CYST ASPIRATION; Right     Comment:  negative No date: BREAST SURGERY; Right     Comment:  lumpectomy No date: CHOLECYSTECTOMY     Comment:  [facility] 07/15/2017: COLONOSCOPY WITH PROPOFOL ; N/A     Comment:  Procedure: COLONOSCOPY WITH PROPOFOL ;  Surgeon:               Gaylyn Gladis PENNER, MD;  Location: [facility] ENDOSCOPY;                Service: Endoscopy;  Laterality: N/A; 05/29/2022: COLONOSCOPY WITH PROPOFOL ; N/A      Comment:  Procedure: COLONOSCOPY WITH PROPOFOL ;  Surgeon: Toledo,               Ladell POUR, MD;  Location: [facility] ENDOSCOPY;  Service:               Gastroenterology;  Laterality: N/A; 07/15/2017: ESOPHAGOGASTRODUODENOSCOPY (EGD) WITH PROPOFOL ; N/A     Comment:  Procedure: ESOPHAGOGASTRODUODENOSCOPY (EGD) WITH               PROPOFOL ;  Surgeon: Gaylyn Gladis PENNER, MD;  Location:               [facility] ENDOSCOPY;  Service: Endoscopy;  Laterality: N/A; No date: haital hernia No date: HERNIA REPAIR; Right     Comment:  x 2 No date: HERNIA REPAIR; Left     Comment:  [place]  BMI    Body Mass Index: 28.34 kg/m      Reproductive/Obstetrics negative OB ROS                              Anesthesia Physical Anesthesia Plan  ASA: 2  Anesthesia Plan: General   Post-op Pain Management: Minimal or no pain anticipated   Induction: Intravenous  PONV Risk Score and Plan: 3 and Treatment may vary due to age or medical  condition, Propofol  infusion and TIVA  Airway Management Planned: Natural Airway and Nasal Cannula  Additional Equipment:   Intra-op Plan:   Post-operative Plan:   Informed Consent: I have reviewed the patients History and Physical, chart, labs and discussed the procedure including the risks, benefits and alternatives for the proposed anesthesia with the patient or authorized representative who has indicated his/her understanding and acceptance.     Dental Advisory Given  Plan Discussed with: Anesthesiologist, CRNA and Surgeon  Anesthesia Plan Comments: (Patient consented for risks of anesthesia including but not limited to:  - adverse reactions to medications - damage to eyes, teeth, lips or other oral mucosa - nerve damage due to positioning  - sore throat or hoarseness - Damage to heart, brain, nerves, lungs, other parts of body or loss of life  Patient voiced understanding and assent.)        Anesthesia Quick Evaluation

## 2024-06-09 NOTE — Op Note (Signed)
 [facility] Gastroenterology Patient Name: Mia Gray Procedure Date: 06/09/2024 8:27 AM MRN: [id number] Account #: [id number] Date of Birth: 1956/08/31 Admit Type: Outpatient Age: 68 Room: [place] [place] ENDO ROOM 2 Gender: Female Note Status: Finalized Instrument Name: [facility] GI Scope [id number] Procedure:             Upper GI endoscopy Indications:           Epigastric abdominal pain, Nausea Providers:             Alyviah Crandle K. Aundria MD, MD Referring MD:          Norleen CHARM Rower, MD (Referring MD) Medicines:             Propofol  per Anesthesia Complications:         No immediate complications. Estimated blood loss:                         Minimal. Procedure:             Pre-Anesthesia Assessment:                        - The risks and benefits of the procedure and the                         sedation options and risks were discussed with the                         patient. All questions were answered and informed                         consent was obtained.                        - Patient identification and proposed procedure were                         verified prior to the procedure by the nurse. The                         procedure was verified in the procedure room.                        - ASA Grade Assessment: III - A patient with severe                         systemic disease.                        - After reviewing the risks and benefits, the patient                         was deemed in satisfactory condition to undergo the                         procedure.                        After obtaining informed consent, the endoscope was  passed under direct vision. Throughout the procedure,                         the patient's blood pressure, pulse, and oxygen                         saturations were monitored continuously. The Endoscope                         was introduced through the mouth, and advanced to the                          third part of duodenum. The upper GI endoscopy was                         accomplished without difficulty. The patient tolerated                         the procedure well. Findings:      The esophagus was normal.      A large amount of food (residue) was found in the cardia and in the       gastric fundus.      Scattered minimal inflammation characterized by erythema was found in       the gastric antrum. Biopsies were taken with a cold forceps for       Helicobacter pylori testing. Estimated blood loss was minimal.      Evidence of a Nissen fundoplication was found in the gastric fundus. The       wrap appeared intact. This was traversed. Estimated blood loss: none.      The exam of the stomach was otherwise normal.      The examined duodenum was normal. Impression:            - Normal esophagus.                        - A large amount of food (residue) in the stomach.                        - Gastritis. Biopsied.                        - A Nissen fundoplication was found. The wrap appears                         intact.                        - Normal examined duodenum. Recommendation:        - Patient has a contact number available for                         emergencies. The signs and symptoms of potential                         delayed complications were discussed with the patient.                         Return to normal activities tomorrow. Written  discharge instructions were provided to the patient.                        - Resume previous diet.                        - Continue present medications.                        - Await pathology results.                        - Follow up with Jonette Primmer, PA-C in the GI office.                         [phone]                        - Telephone GI office to schedule appointment in 1                         month.                        - The findings and recommendations were discussed with                          the patient. Procedure Code(s):     --- Professional ---                        [phone], Esophagogastroduodenoscopy, flexible,                         transoral; with biopsy, single or multiple Diagnosis Code(s):     --- Professional ---                        R11.0, Nausea                        R10.13, Epigastric pain                        Z98.890, Other specified postprocedural states                        K29.70, Gastritis, unspecified, without bleeding CPT copyright 2022 American Medical Association. All rights reserved. The codes documented in this report are preliminary and upon coder review may  be revised to meet current compliance requirements. Ladell MARLA Boss MD, MD 06/09/2024 8:44:58 AM This report has been signed electronically. Number of Addenda: 0 Note Initiated On: 06/09/2024 8:27 AM Estimated Blood Loss:  Estimated blood loss was minimal.      [facility]

## 2024-06-09 NOTE — Interval H&P Note (Signed)
 History and Physical Interval Note:  06/09/2024 8:30 AM  Nena CHRISTELLA Pollack  has presented today for surgery, with the diagnosis of Abdominal pain, acute, epigastric (R10.13) Chronic nausea (R11.0) History of resection of small bowel (Z90.49) Small intestinal bacterial overgrowth (SIBO), hydrogen subtype (X36.1788).  The various methods of treatment have been discussed with the patient and family. After consideration of risks, benefits and other options for treatment, the patient has consented to  Procedure(s): EGD (ESOPHAGOGASTRODUODENOSCOPY) (N/A) as a surgical intervention.  The patient's history has been reviewed, patient examined, no change in status, stable for surgery.  I have reviewed the patient's chart and labs.  Questions were answered to the patient's satisfaction.     Fonda, Jamaar Howes

## 2024-06-09 NOTE — H&P (Signed)
 Outpatient short stay form Pre-procedure 06/09/2024 8:28 AM Mia Gray, M.D.  Primary Physician: Mia Gray, M.D.  Reason for visit:  Epigastric pain, Nausea  History of present illness:  Mia Gray presents to the [facility] GI clinic for acute visit for chief complaint of acute epigastric abdominal pain. She endorses intermittent epigastric abdominal pain over the past 2-days. Pain seemed to start Tuesday after she got home from work and had just finished dinner. Pain lasted for 1 hour and was described as stabbing and hurting pain. She woke up yesterday and ate lunch and then noticed a return of the abdominal pain. Pain seems to stay centralized in her epigastric region and does not radiate. Pain lasted for 2 hours yesterday. She rates the pain as 8 or 9 out of 10 on the pain scale. This morning she was awoken up at 4:30 AM with the pain that lasted for 3 hours before it went away. She took some Tylenol  but couldn't tell if this made a difference. Food clearly seems to make the pain worse. The only thing she has had to eat so far today is some cheese crackers and sips of water . She has nausea most days at baseline. No associated vomiting. She hasn't had a change in her bowel habits since symptoms began. Of note, last Saturday and Sunday she did have flu-like symptoms with chills and mild cough. Her son bought her some Theraflu and symptoms seemed to go away on their own. She did not have any fevers. She has no upper respiratory symptoms at this time. She did have recent diagnosis of SIBO with hydrogen subtype. She was treated with 14-day course of Rifaximin. No change in her bowel habits since taking the medication.      Current Facility-Administered Medications:    0.9 %  sodium chloride  infusion, , Intravenous, Continuous, Mia Gray, Mia Monnier K, MD, Last Rate: 20 mL/hr at 06/09/24 0736, 500 mL at 06/09/24 0736  Medications Prior to Admission  Medication Sig Dispense Refill Last Dose/Taking    acetaminophen  (TYLENOL ) 325 MG tablet Take 2 tablets (650 mg total) by mouth 2 (two) times daily as needed for moderate pain or fever. 20 tablet 0 06/08/2024   cetirizine (ZYRTEC) 10 MG tablet Take 10 mg by mouth daily as needed for allergies.   Past Week   Cholecalciferol (VITAMIN D-1000 MAX ST) 25 MCG (1000 UT) tablet Take by mouth.   06/08/2024   CREON  36000-114000 units CPEP capsule Take 36,000 Units by mouth 3 (three) times daily.   06/08/2024   cyanocobalamin (VITAMIN B12) 1000 MCG tablet Take by mouth.   06/08/2024   GARLIC PO Take by mouth.   06/08/2024   lisinopril  (ZESTRIL ) 5 MG tablet Take 5 mg by mouth daily.   06/08/2024   metoprolol  succinate (TOPROL -XL) 25 MG 24 hr tablet Take 1 tablet by mouth daily.   06/08/2024   ondansetron  (ZOFRAN -ODT) 4 MG disintegrating tablet Take 4 mg by mouth every 8 (eight) hours as needed for nausea or vomiting.   Past Week   topiramate  (TOPAMAX ) 100 MG tablet Take 100 mg by mouth 2 (two) times daily.   06/08/2024   ketoconazole  (NIZORAL ) 2 % shampoo Shampoo into scalp let sit 5-10 minutes then wash out. Use 3 days per week. (Patient not taking: Reported on 06/09/2024) 120 mL 6 Not Taking   lisinopril  (ZESTRIL ) 10 MG tablet Take 5 mg by mouth daily. (Patient not taking: Reported on 06/09/2024)   Not Taking   metoprolol  tartrate (LOPRESSOR ) 25  MG tablet Take 25 mg by mouth daily.      mometasone  (ELOCON ) 0.1 % lotion Apply to itchy areas of scalp qd up to 5 days per week prn itching (Patient not taking: Reported on 06/09/2024) 60 mL 2 Not Taking   ondansetron  (ZOFRAN ) 4 MG tablet Take 4 mg by mouth every 8 (eight) hours as needed for nausea.      SUMAtriptan (IMITREX) 100 MG tablet TAKE 1 TABLET ONCE DAILY AS NEEDED FOR MIGRAINE HEADACHE.  MAY TAKE A SECOND DOSE AFTER 2 HOURS IF NEEDED   06/06/2024     Allergies  Allergen Reactions   Hydrocodone Nausea And Vomiting     Past Medical History:  Diagnosis Date   Actinic keratosis    B12 deficiency     Bronchitis    Dyspepsia    Hiatal hernia    Hypertension    Melanoma (HCC) 10/21/2017   Right posterior thigh. MM, tumor thickness 0.50mm, Anatomic level III. Excised: 01/06/2018, margins free.   Migraine    Palpitations    Pancreatic insufficiency    Seizure disorder (HCC)    Vitamin D deficiency     Review of systems:  Otherwise negative.    Physical Exam  Gen: Alert, oriented. Appears stated age.  HEENT: /AT. PERRLA. Lungs: CTA, no wheezes. CV: RR nl S1, S2. Abd: soft, benign, no masses. BS+ Ext: No edema. Pulses 2+    Planned procedures: Proceed with EGD. The patient understands the nature of the planned procedure, indications, risks, alternatives and potential complications including but not limited to bleeding, infection, perforation, damage to internal organs and possible oversedation/side effects from anesthesia. The patient agrees and gives consent to proceed.  Please refer to procedure notes for findings, recommendations and patient disposition/instructions.     Mia Gray Mia Gray, M.D. Gastroenterology 06/09/2024  8:28 AM

## 2024-06-10 LAB — SURGICAL PATHOLOGY

## 2024-06-10 NOTE — Anesthesia Postprocedure Evaluation (Signed)
 Anesthesia Post Note  Patient: Mia Gray  Procedure(s) Performed: EGD (ESOPHAGOGASTRODUODENOSCOPY)  Patient location during evaluation: Endoscopy Anesthesia Type: General Level of consciousness: awake and alert Pain management: pain level controlled Vital Signs Assessment: post-procedure vital signs reviewed and stable Respiratory status: spontaneous breathing, nonlabored ventilation, respiratory function stable and patient connected to nasal cannula oxygen Cardiovascular status: blood pressure returned to baseline and stable Postop Assessment: no apparent nausea or vomiting Anesthetic complications: no   No notable events documented.   Last Vitals:  Vitals:   06/09/24 0848 06/09/24 0858  BP: 115/77 123/70  Pulse: 74 71  Resp: 15 11  Temp:    SpO2: 100% 100%    Last Pain:  Vitals:   06/09/24 0858  TempSrc:   PainSc: 0-No pain                 Prentice Murphy

## 2024-08-26 ENCOUNTER — Ambulatory Visit: Admitting: Dermatology

## 2024-10-28 ENCOUNTER — Ambulatory Visit: Admitting: Dermatology
